# Patient Record
Sex: Male | Born: 1940 | Race: White | Hispanic: No | Marital: Married | Smoking: Former smoker
Health system: Southern US, Community
[De-identification: ages and names within clinical notes are randomized; demographics above are authoritative.]

## PROBLEM LIST (undated history)

## (undated) DIAGNOSIS — K209 Esophagitis, unspecified without bleeding: Secondary | ICD-10-CM

## (undated) DIAGNOSIS — I2510 Atherosclerotic heart disease of native coronary artery without angina pectoris: Secondary | ICD-10-CM

## (undated) DIAGNOSIS — E785 Hyperlipidemia, unspecified: Secondary | ICD-10-CM

## (undated) DIAGNOSIS — K449 Diaphragmatic hernia without obstruction or gangrene: Secondary | ICD-10-CM

## (undated) DIAGNOSIS — K219 Gastro-esophageal reflux disease without esophagitis: Secondary | ICD-10-CM

## (undated) DIAGNOSIS — K2270 Barrett's esophagus without dysplasia: Secondary | ICD-10-CM

## (undated) DIAGNOSIS — N529 Male erectile dysfunction, unspecified: Secondary | ICD-10-CM

## (undated) DIAGNOSIS — K635 Polyp of colon: Secondary | ICD-10-CM

## (undated) DIAGNOSIS — M1990 Unspecified osteoarthritis, unspecified site: Secondary | ICD-10-CM

## (undated) DIAGNOSIS — C61 Malignant neoplasm of prostate: Secondary | ICD-10-CM

## (undated) DIAGNOSIS — I10 Essential (primary) hypertension: Secondary | ICD-10-CM

## (undated) HISTORY — DX: Diaphragmatic hernia without obstruction or gangrene: K44.9

## (undated) HISTORY — DX: Atherosclerotic heart disease of native coronary artery without angina pectoris: I25.10

## (undated) HISTORY — DX: Essential (primary) hypertension: I10

## (undated) HISTORY — DX: Esophagitis, unspecified without bleeding: K20.90

## (undated) HISTORY — PX: REPLACEMENT TOTAL KNEE: SUR1224

## (undated) HISTORY — DX: Barrett's esophagus without dysplasia: K22.70

## (undated) HISTORY — DX: Male erectile dysfunction, unspecified: N52.9

## (undated) HISTORY — DX: Unspecified osteoarthritis, unspecified site: M19.90

## (undated) HISTORY — DX: Esophagitis, unspecified: K20.9

## (undated) HISTORY — DX: Malignant neoplasm of prostate: C61

## (undated) HISTORY — PX: PROSTATE SURGERY: SHX751

## (undated) HISTORY — DX: Polyp of colon: K63.5

## (undated) HISTORY — DX: Hyperlipidemia, unspecified: E78.5

## (undated) HISTORY — DX: Gastro-esophageal reflux disease without esophagitis: K21.9

---

## 1995-10-05 HISTORY — PX: CORONARY ARTERY BYPASS GRAFT: SHX141

## 2001-08-21 HISTORY — PX: ANGIOPLASTY: SHX39

## 2008-12-20 ENCOUNTER — Encounter: Admission: RE | Admit: 2008-12-20 | Discharge: 2008-12-20 | Payer: Self-pay | Admitting: Orthopedic Surgery

## 2010-06-12 ENCOUNTER — Ambulatory Visit: Payer: Self-pay | Admitting: Diagnostic Radiology

## 2010-06-12 ENCOUNTER — Emergency Department (HOSPITAL_BASED_OUTPATIENT_CLINIC_OR_DEPARTMENT_OTHER): Admission: EM | Admit: 2010-06-12 | Discharge: 2010-06-13 | Payer: Self-pay | Admitting: Emergency Medicine

## 2010-12-17 LAB — CBC
MCH: 27.8 pg (ref 26.0–34.0)
MCV: 82.7 fL (ref 78.0–100.0)
Platelets: 399 10*3/uL (ref 150–400)
RBC: 5.34 MIL/uL (ref 4.22–5.81)

## 2010-12-17 LAB — COMPREHENSIVE METABOLIC PANEL
Albumin: 4.1 g/dL (ref 3.5–5.2)
BUN: 33 mg/dL — ABNORMAL HIGH (ref 6–23)
Chloride: 106 mEq/L (ref 96–112)
Creatinine, Ser: 1.5 mg/dL (ref 0.4–1.5)
GFR calc non Af Amer: 46 mL/min — ABNORMAL LOW (ref >60)
Glucose, Bld: 160 mg/dL — ABNORMAL HIGH (ref 70–99)
Total Bilirubin: 0.8 mg/dL (ref 0.3–1.2)

## 2010-12-17 LAB — POCT CARDIAC MARKERS
CKMB, poc: 1.1 ng/mL (ref 1.0–8.0)
Myoglobin, poc: 130 ng/mL (ref 12–200)
Troponin i, poc: <0.05 ng/mL (ref 0.00–0.09)

## 2010-12-17 LAB — DIFFERENTIAL
Basophils Absolute: 0.1 10*3/uL (ref 0.0–0.1)
Lymphocytes Relative: 35 % (ref 12–46)
Neutro Abs: 5.4 10*3/uL (ref 1.7–7.7)
Neutrophils Relative %: 54 % (ref 43–77)

## 2013-01-12 ENCOUNTER — Other Ambulatory Visit: Payer: Self-pay | Admitting: Family Medicine

## 2013-01-26 ENCOUNTER — Other Ambulatory Visit: Payer: Self-pay | Admitting: Family Medicine

## 2013-03-28 ENCOUNTER — Ambulatory Visit (INDEPENDENT_AMBULATORY_CARE_PROVIDER_SITE_OTHER): Payer: Self-pay | Admitting: Family Medicine

## 2013-03-28 VITALS — BP 147/87 | HR 67 | Wt 202.0 lb

## 2013-03-28 DIAGNOSIS — R070 Pain in throat: Secondary | ICD-10-CM

## 2013-03-28 MED ORDER — MAGIC MOUTHWASH: ORAL | Status: DC

## 2013-03-28 NOTE — Patient Instructions (Addendum)
1)  Hoarseness/Throat Symptoms - Salt Water Gargle, Duke's Magic Mouthwash, Umcka Cold Care (2 droppers 3-4 x per day)    Salt Water Gargle This solution will help make your mouth and throat feel better. HOME CARE INSTRUCTIONS   Mix 1 teaspoon of salt in 8 ounces of warm water.  Gargle with this solution as much or often as you need or as directed. Swish and gargle gently if you have any sores or wounds in your mouth.  Do not swallow this mixture. Document Released: 06/24/2004 Document Revised: 12/13/2011 Document Reviewed: 11/15/2008 [facility] Patient Information 2014 [place], [place].   Hoarseness Hoarseness is produced from a variety of causes. It is important to find the cause so it can be treated. In the absence of a cold or upper respiratory illness, any hoarseness lasting more than 2 weeks should be looked at by a specialist. This is especially important if you have a history of smoking or alcohol use. It is also important to keep in mind that as you grow older, your voice will naturally get weaker, making it easier for you to become hoarse from straining your vocal cords.  CAUSES  Any illness that affects your vocal cords can result in a hoarse voice. Examples of conditions that can affect the vocal cords are listed as follows:   Allergies.  Colds.  Sinusitis.  Gastroesophageal reflux disease.  Croup.  Injury.  Nodules.  Exposure to smoke or toxic fumes or gases.  Congenital and genetic defects.  Paralysis of the vocal cords.  Infections.  Advanced age. DIAGNOSIS  In order to diagnose the cause of your hoarseness, your caregiver will examine your throat using an instrument that uses a tube with a small lighted camera (laryngoscope). It allows your caregiver to look into the mouth and down the throat. TREATMENT  For most cases, treatment will focus on the specific cause of the hoarseness. Depending on the cause, hoarseness can be a temporary condition (acute) or it  can be long lasting (chronic). Most cases of hoarseness clear up without complications. Your caregiver will explain to you if this is not likely to happen. SEEK IMMEDIATE MEDICAL CARE IF:   You have increasing hoarseness or loss of voice.  You have shortness of breath.  You are coughing up blood.  There is pain in your neck or throat. Document Released: 09/03/2005 Document Revised: 12/13/2011 Document Reviewed: 11/26/2010 [facility] Patient Information 2014 [place], [place].

## 2013-03-28 NOTE — Progress Notes (Signed)
  Subjective:    Patient ID: Jonathan Wall, male    DOB: [birth year], 72 y.o.   MRN: [id number]  HPI  Jonathan Wall is here today complaining of URI symptoms including chest congestion and hoarseness which started several days ago.  He describes his symptoms as being mild to moderate in nature and he feels that he is not improving.  He feels that his symptoms may be related to allergies vs irritation due to his recent endocscopy.     Review of Systems  Constitutional: Negative for fever and chills.  HENT: Positive for congestion and sore throat.   Respiratory: Positive for cough. Negative for shortness of breath.   Cardiovascular: Negative for chest pain.  All other systems reviewed and are negative.   Past Medical History  Diagnosis Date  . CAD (coronary artery disease)   . Prostate cancer   . Hypertension   . Hyperlipidemia   . Osteoarthritis   . ED (erectile dysfunction)   . Hiatal hernia   . Colon polyps   . Barrett's esophagus     Clotest was negative, Dr. Myra Gianotti  . Esophagitis     Family History  Problem Relation Age of Onset  . Alzheimer's disease Mother   . Alzheimer's disease Father   . Heart disease Brother   . Alcohol abuse Brother   . Heart disease Brother    History   Social History Narrative   Marital Status: Married Chemical engineer)   Children:  Son Casimiro Needle)   Pets:  None   Living Situation: Lives with his wife.   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     Alcohol Use: None   Drug Use:  None   Diet:  Regular   Exercise: Minimal 1-2 x per month   Hobbies: Internet, Writing             Objective:   Physical Exam  Constitutional: He appears well-nourished. No distress.  HENT:  Mouth/Throat: No oropharyngeal exudate.  Eyes: Conjunctivae are normal.  Neck: Neck supple.  Cardiovascular: Normal rate, regular rhythm and normal heart sounds.    Pulmonary/Chest: Effort normal and breath sounds normal. No respiratory distress. He has no wheezes. He has no rales.  Lymphadenopathy:    He has no cervical adenopathy.          Assessment & Plan:

## 2013-04-10 ENCOUNTER — Encounter: Payer: Self-pay | Admitting: Family Medicine

## 2013-04-10 ENCOUNTER — Ambulatory Visit (HOSPITAL_BASED_OUTPATIENT_CLINIC_OR_DEPARTMENT_OTHER)
Admission: RE | Admit: 2013-04-10 | Discharge: 2013-04-10 | Disposition: A | Discharge status: Home / Self Care | Payer: Medicare Other | Source: Ambulatory Visit | Attending: Family Medicine | Admitting: Family Medicine

## 2013-04-10 ENCOUNTER — Ambulatory Visit (INDEPENDENT_AMBULATORY_CARE_PROVIDER_SITE_OTHER): Payer: Medicare Other | Admitting: Family Medicine

## 2013-04-10 ENCOUNTER — Other Ambulatory Visit: Payer: Self-pay | Admitting: Family Medicine

## 2013-04-10 VITALS — BP 129/74 | HR 62 | Wt 202.0 lb

## 2013-04-10 DIAGNOSIS — R52 Pain, unspecified: Secondary | ICD-10-CM

## 2013-04-10 DIAGNOSIS — M799 Soft tissue disorder, unspecified: Secondary | ICD-10-CM | POA: Insufficient documentation

## 2013-04-10 DIAGNOSIS — S46212A Strain of muscle, fascia and tendon of other parts of biceps, left arm, initial encounter: Secondary | ICD-10-CM

## 2013-04-10 DIAGNOSIS — S43499A Other sprain of unspecified shoulder joint, initial encounter: Secondary | ICD-10-CM

## 2013-04-10 DIAGNOSIS — S46219A Strain of muscle, fascia and tendon of other parts of biceps, unspecified arm, initial encounter: Secondary | ICD-10-CM | POA: Insufficient documentation

## 2013-04-10 DIAGNOSIS — S46819A Strain of other muscles, fascia and tendons at shoulder and upper arm level, unspecified arm, initial encounter: Secondary | ICD-10-CM

## 2013-04-10 NOTE — Progress Notes (Signed)
  Subjective:    Patient ID: Jonathan Wall, male    DOB: 29-Sep-1941, 72 y.o.   MRN: [id number]  Jonathan Wall is here today to have his left arm evaluated.  On Friday, he was packing his car and lifting various objects (e.g. Cooler with watermelon) when he felt a pop in his left arm.  Almost immediately, he noticed that there was a large lump in his upper arm.  Arm Pain  The incident occurred 3 to 5 days ago. The incident occurred at home. Injury mechanism: lifting a cooler with a watermelon that shifted. The pain is present in the upper left arm. The quality of the pain is described as aching and stabbing. The pain does not radiate. The pain is at a severity of 4/10. The pain is mild. The pain has been fluctuating since the incident. Pertinent negatives include no muscle weakness, numbness or tingling. The symptoms are aggravated by movement and lifting. He has tried ice for the symptoms. The treatment provided no relief.     Review of Systems  Musculoskeletal:       Large lump in upper left arm  Neurological: Negative for tingling and numbness.       Objective:   Physical Exam  Constitutional: He appears well-nourished. No distress.  Musculoskeletal:  Swelling in left upper arm consistent with torn vs unattached bicep muscle   Skin: Skin is warm and dry.          Assessment & Plan:

## 2013-04-10 NOTE — Patient Instructions (Addendum)
Biceps Tendon Disruption (Proximal) with Rehab  Appointment:  04/10/2013 (Dr. Thamas Jaegers) 4 pm    The biceps tendon attaches the biceps muscle to the bones of the elbow and the shoulder. A proximal biceps tendon disruption is a tear of the long head of the tendon that attaches near the shoulder (more common) or a tear in the muscle near the muscle tendon junction (less common). These injuries usually involve a complete tear of the tendon from the bone. However, partial tears are also possible. The biceps muscle works with other muscles to bend the elbow and rotate the palm upward (supinate). A complete biceps rupture will result in about a 10% decrease in elbow bending strength and a 20% decrease in your ability to turn the palm upward, using the wrist. SYMPTOMS   Pain, tenderness, swelling, warmth, or redness over the front of the shoulder.  Pain that gets worse with shoulder and elbow use, especially against resistance (lifting or carrying).  Bruising (contusion) in the arm or elbow after 24 to 48 hours.  Bulge can be seen and felt in the arm.  Limited motion of the shoulder or elbow.  Weakness with attempted elbow bending or rotation of the wrist, such as when using a screwdriver.  Crackling sound (crepitation) when the tendon or shoulder is moved or touched. CAUSES  A biceps tendon rupture occurs when the tendon is subjected to a force that is greater than it can withstand. For example, a sudden force straightening the elbow while the biceps is flexed, or a direct hit (trauma) (rare). RISK INCREASES WITH:   Sports that involve contact, or throwing and overhead activities (racquet sports, gymnastics, weightlifting, bodybuilding).  Heavy labor.  Poor strength and flexibility.  Failure to warm up properly before activity. PREVENTION  Warm up and stretch properly before activity.  Maintain physical fitness:  Strength, flexibility, and endurance.  Cardiovascular fitness.  Allow  your body to recover between practices and competition.  Learn and use proper exercise technique. PROGNOSIS  If treated properly, the symptoms of a proximal biceps tendon disruption usually go away within 12 weeks of injury.  RELATED COMPLICATIONS  Longer healing time if not properly treated, or if not given enough time to heal.  Recurring symptoms, especially if activity is resumed too soon.  Weakness of elbow bending and forearm rotation.  Prolonged disability (uncommon). TREATMENT Treatment first involves the use of ice and medicine, to reduce pain and inflammation. It is also important to perform strengthening and stretching exercises and to modify activities that cause symptoms to get worse. These exercises may be performed at home or with a therapist. It is not possible to surgically fix the tendon (sew it together). However, surgery may be performed to reinsert the tendon into the arm bone. This will make the arm look normal again. Surgery is most often advised for younger, active individuals, especially those who require strength of wrist rotation.  MEDICATION  If pain medicine is needed, nonsteroidal anti-inflammatory medicines (aspirin and ibuprofen), or other minor pain relievers (acetaminophen), are often advised.  Do not take pain medicine for 7 days before surgery.  Prescription pain relievers may be given if your caregiver thinks they are needed. Use only as directed and only as much as you need.  Corticosteroid injections may be given to help reduce inflammation, but are not usually recommended for this injury. HEAT AND COLD  Cold treatment (icing) should be applied for 10 to 15 minutes every 2 to 3 hours for inflammation and pain,  and immediately after activity that aggravates your symptoms. Use ice packs or an ice massage.  Heat treatment may be used before performing stretching and strengthening activities prescribed by your caregiver, physical therapist, or athletic  trainer. Use a heat pack or a warm water soak. SEEK MEDICAL CARE IF:   Symptoms get worse or do not improve in 2 weeks, despite treatment.  New, unexplained symptoms develop. (Drugs used in treatment may produce side effects.) EXERCISES RANGE OF MOTION (ROM) AND STRETCHING EXERCISES - Biceps Tendon Disruption (Proximal) These exercises may help you when beginning to rehabilitate your injury. Your symptoms may go away with or without further involvement from your physician, physical therapist or athletic trainer. While completing these exercises, remember:   Restoring tissue flexibility helps normal motion to return to the joints. This allows healthier, less painful movement and activity.  An effective stretch should be held for at least 30 seconds.  A stretch should never be painful. You should only feel a gentle lengthening or release in the stretched tissue. STRETCH  Flexion, Standing  Stand with good posture. With an underhand grip on your right / left hand and an overhand grip on the opposite hand, grasp a broomstick or cane so that your hands are a little more than shoulder width apart.  Keeping your right / left elbow straight and shoulder muscles relaxed, push the stick with your opposite hand to raise your right / left arm in front of your body and then overhead. Raise your arm until you feel a stretch in your right / left shoulder, but before you have increased shoulder pain.  Try to avoid shrugging your right / left shoulder as your arm rises by keeping your shoulder blade tucked down and toward your mid-back spine. Hold __________ seconds.  Slowly return to the starting position. Repeat __________ times. Complete this exercise __________ times per day. STRETCH  Abduction, Supine  Stand with good posture. With an underhand grip on your right / left hand and an overhand grip on the opposite hand, grasp a broomstick or cane so that your hands are a little more than shoulder-width  apart.  Keeping your right / left elbow straight and shoulder muscles relaxed, push the stick with your opposite hand to raise your right / left arm out to the side of your body and then overhead. Raise your arm until you feel a stretch in your right / left shoulder, but before you have increased shoulder pain.  Try to avoid shrugging your right / left shoulder as your arm rises, by keeping your shoulder blade tucked down and toward your mid-back spine. Hold for __________ seconds.  Slowly return to the starting position. Repeat __________ times. Complete this exercise __________ times per day. ROM  Flexion, Active-Assisted  Lie on your back. You may bend your knees for comfort.  Grasp a broomstick or cane so your hands are about shoulder width apart. Your right / left hand should grip the end of the stick so that your hand is positioned "thumbs-up," as if you were about to shake hands.  Using your healthy arm to lead, raise your right / left arm overhead until you feel a gentle stretch in your shoulder. Hold for right / left seconds.  Use the stick to assist in returning your right / left arm to its starting position. Repeat __________ times. Complete this exercise __________ times per day.  STRETCH  Flexion, Standing   Stand facing a wall. Walk your right / left fingers  up the wall until you feel a moderate stretch in your shoulder. As your hand gets higher, you may need to step closer to the wall or use a door frame to walk through.  Try to avoid shrugging your right / left shoulder as your arm rises, by keeping your shoulder blade tucked down and toward your mid-back spine.  Hold for __________ seconds. Use your other hand, if needed, to ease out of the stretch and return to the starting position. Repeat __________ times. Complete this exercise __________ times per day.  ROM - Internal Rotation   Using underhand grips, grasp a stick behind your back with both hands.  While standing  upright with good posture, slide the stick up your back until you feel a mild stretch in the front of your shoulder.  Hold for __________ seconds. Slowly return to your starting position. Repeat __________ times. Complete this exercise __________ times per day.  STRETCH - Internal Rotation  Place your right / left hand behind your back, palm-up.  Throw a towel or belt over your opposite shoulder. Grasp the towel with your right / left hand.  While keeping an upright posture, gently pull up on the towel until you feel a stretch in the front of your right / left shoulder.  Avoid shrugging your right / left shoulder as your arm rises, by keeping your shoulder blade tucked down and toward your mid-back spine.  Hold for __________ seconds. Release the stretch by lowering your opposite hand. Repeat __________ times. Complete this exercise __________ times per day. STRETCH  Elbow Flexors   Lie on a firm bed or countertop on your back. Be sure that you are in a comfortable position which will allow you to relax your arm muscles.  Place a folded towel under your right / left upper arm, so that your elbow and shoulder are at the same height. Extend your arm; your elbow should not rest on the bed or towel.  Allow the weight of your hand to straighten your elbow. Keep your arm and chest muscles relaxed. Your caregiver may ask you to increase the intensity of your stretch by adding a small wrist or hand weight.  Hold for __________ seconds. You should feel a stretch on the inside of your elbow. Slowly return to the starting position. Repeat __________ times. Complete this exercise __________ times per day. STRENGTHENING EXERCISES - Biceps Tendon Disruption (Proximal) These exercises may help you regain your strength after your physician has discontinued your restraint in a cast or brace. They may resolve your symptoms with or without further involvement from your physician, physical therapist or  athletic trainer. While completing these exercises, remember:   Muscles can gain both the endurance and the strength needed for everyday activities through controlled exercises.  Complete these exercises as instructed by your physician, physical therapist or athletic trainer. Progress the resistance and repetitions only as guided.  You may experience muscle soreness or fatigue, but the pain or discomfort you are trying to eliminate should never worsen during these exercises. If this pain does get worse, stop and make sure you are following the directions exactly. If the pain is still present after adjustments, discontinue the exercise until you can discuss the trouble with your clinician. STRENGTH - Elbow Flexors, Isometric   Stand or sit upright on a firm surface. Place your right / left arm so that your hand is palm-up and at the height of your waist.  Place your opposite hand on top of  your forearm. Gently push down as your right / left arm resists. Push as hard as you can with both arms, without causing any pain or movement at your right / left elbow. Hold this stationary position for __________ seconds.  Gradually release the tension in both arms. Allow your muscles to relax completely before repeating. Repeat __________ times. Complete this exercise __________ times per day. STRENGTH - Shoulder Flexion, Isometric  With good posture and facing a wall, stand or sit about 4-6 inches away.  Keeping your right / left elbow straight, gently press the top of your fist into the wall. Increase the pressure gradually until you are pressing as hard as you can without shrugging your shoulder or increasing any shoulder discomfort.  Hold for __________ seconds.  Release the tension slowly. Relax your shoulder muscles completely before you the next repetition. Repeat __________ times. Complete this exercise __________ times per day.  STRENGTH  Elbow Flexors, Supinated  With good posture, stand or  sit on a firm chair without armrests. Allow your right / left arm to rest at your side with your palm facing forward.  Holding a __________weight or gripping a rubber exercise band or tubing, bring your hand toward your shoulder.  Allow your muscles to control the resistance as your hand returns to your side. Repeat __________ times. Complete this exercise __________ times per day.  STRENGTH  Elbow Flexors, Neutral  With good posture, stand or sit on a firm chair without armrests. Allow your right / left arm to rest at your side with your thumb facing forward.  Holding a __________ weight or gripping a rubber exercise band or tubing, bring your hand toward your shoulder.  Allow your muscles to control the resistance as your hand returns to your side. Repeat __________ times. Complete this exercise __________ times per day.  STRENGTH - Shoulder Flexion   Stand or sit with good posture. Grasp a __________ weight, or an exercise band or tubing, so that your hand is "thumbs-up," like when you shake hands.  Slowly lift your right / left arm as far as you can without increasing any shoulder pain. Initially, many people can only raise their hand to shoulder height.  Avoid shrugging your right / left shoulder as your arm rises, by keeping your shoulder blade tucked down and toward your mid-back spine.  Hold for __________ seconds. Control the descent of your hand as you slowly return to your starting position. Repeat __________ times. Complete this exercise __________ times per day.  STRENGTH  Forearm Supinators   Sit with your right / left forearm supported on a table, keeping your elbow below shoulder height. Rest your hand over the edge, palm down.  Gently grip a hammer or a soup ladle.  Without moving your elbow, slowly turn your palm and hand upward to a "thumbs-up" position.  Hold this position for __________ seconds. Slowly return to the starting position. Repeat __________ times.  Complete this exercise __________ times per day.  STRENGTH  Forearm Pronators   Sit with your right / left forearm supported on a table, keeping your elbow below shoulder height. Rest your hand over the edge, palm up.  Gently grip a hammer or a soup ladle.  Without moving your elbow, slowly turn your palm and hand upward to a "thumbs-up" position.  Hold this position for __________ seconds. Slowly return to the starting position. Repeat __________ times. Complete this exercise __________ times per day.  Document Released: 09/20/2005 Document Revised: 12/13/2011 Document Reviewed:  01/02/2009 [employer] Patient Information 2014 [place], [place].

## 2013-04-12 ENCOUNTER — Other Ambulatory Visit: Payer: Self-pay | Admitting: Family Medicine

## 2013-04-17 ENCOUNTER — Other Ambulatory Visit: Payer: Self-pay | Admitting: Family Medicine

## 2013-05-10 ENCOUNTER — Encounter: Payer: Self-pay | Admitting: Family Medicine

## 2013-05-10 DIAGNOSIS — R070 Pain in throat: Secondary | ICD-10-CM | POA: Insufficient documentation

## 2013-05-10 NOTE — Assessment & Plan Note (Signed)
He was given a prescription for Duke's Magic Mouthwash.

## 2013-05-12 NOTE — Assessment & Plan Note (Signed)
Sent for an x-ray and to [facility] for further evaluation.

## 2013-06-01 ENCOUNTER — Other Ambulatory Visit: Payer: Self-pay | Admitting: Family Medicine

## 2013-06-01 NOTE — Telephone Encounter (Signed)
Several of his meds have been refilled as courtesy.  I just sent 30 day of his metoprolol.  He needs labs and to see Dr Herma Carson. Thanks PG

## 2013-06-06 ENCOUNTER — Other Ambulatory Visit: Payer: Self-pay | Admitting: Family Medicine

## 2013-06-07 NOTE — Telephone Encounter (Signed)
This pt needs an appointment for labs and see Dr Herma Carson.  We have seen him a couple of times for acute problems.  Thanks PG

## 2013-06-08 ENCOUNTER — Other Ambulatory Visit: Payer: Medicare Other

## 2013-06-08 ENCOUNTER — Other Ambulatory Visit: Payer: Self-pay | Admitting: *Deleted

## 2013-06-08 DIAGNOSIS — R5381 Other malaise: Secondary | ICD-10-CM

## 2013-06-08 DIAGNOSIS — E785 Hyperlipidemia, unspecified: Secondary | ICD-10-CM

## 2013-06-11 ENCOUNTER — Other Ambulatory Visit: Payer: Self-pay | Admitting: Family Medicine

## 2013-06-11 ENCOUNTER — Other Ambulatory Visit (INDEPENDENT_AMBULATORY_CARE_PROVIDER_SITE_OTHER): Payer: Medicare Other

## 2013-06-11 LAB — CBC WITH DIFFERENTIAL/PLATELET
Basophils Absolute: 0.1 10*3/uL (ref 0.0–0.1)
Basophils Relative: 1 % (ref 0–1)
Eosinophils Absolute: 0.9 10*3/uL — ABNORMAL HIGH (ref 0.0–0.7)
Eosinophils Relative: 14 % — ABNORMAL HIGH (ref 0–5)
HCT: 43 % (ref 39.0–52.0)
Hemoglobin: 14.8 g/dL (ref 13.0–17.0)
Lymphocytes Relative: 28 % (ref 12–46)
Lymphs Abs: 1.8 10*3/uL (ref 0.7–4.0)
MCH: 30.1 pg (ref 26.0–34.0)
MCHC: 34.4 g/dL (ref 30.0–36.0)
MCV: 87.6 fL (ref 78.0–100.0)
Monocytes Absolute: 0.6 10*3/uL (ref 0.1–1.0)
Monocytes Relative: 9 % (ref 3–12)
Neutro Abs: 3 10*3/uL (ref 1.7–7.7)
Neutrophils Relative %: 48 % (ref 43–77)
Platelets: 241 10*3/uL (ref 150–400)
RBC: 4.91 MIL/uL (ref 4.22–5.81)
RDW: 14.3 % (ref 11.5–15.5)
WBC: 6.3 10*3/uL (ref 4.0–10.5)

## 2013-06-11 LAB — COMPLETE METABOLIC PANEL WITH GFR
ALT: 19 U/L (ref 0–53)
AST: 21 U/L (ref 0–37)
Albumin: 3.8 g/dL (ref 3.5–5.2)
Alkaline Phosphatase: 86 U/L (ref 39–117)
BUN: 17 mg/dL (ref 6–23)
CO2: 25 mEq/L (ref 19–32)
Calcium: 9.1 mg/dL (ref 8.4–10.5)
Chloride: 110 mEq/L (ref 96–112)
Creat: 0.84 mg/dL (ref 0.50–1.35)
GFR, Est African American: >89 mL/min
GFR, Est Non African American: 87 mL/min
Glucose, Bld: 104 mg/dL — ABNORMAL HIGH (ref 70–99)
Potassium: 4.2 mEq/L (ref 3.5–5.3)
Sodium: 141 mEq/L (ref 135–145)
Total Bilirubin: 0.5 mg/dL (ref 0.3–1.2)
Total Protein: 6 g/dL (ref 6.0–8.3)

## 2013-06-11 LAB — LIPID PANEL
Cholesterol: 117 mg/dL (ref 0–200)
HDL: 34 mg/dL — ABNORMAL LOW (ref >39)
LDL Cholesterol: 60 mg/dL (ref 0–99)
Total CHOL/HDL Ratio: 3.4 Ratio
Triglycerides: 117 mg/dL (ref <150)
VLDL: 23 mg/dL (ref 0–40)

## 2013-06-14 ENCOUNTER — Ambulatory Visit (INDEPENDENT_AMBULATORY_CARE_PROVIDER_SITE_OTHER): Payer: Medicare Other | Admitting: Family Medicine

## 2013-06-14 ENCOUNTER — Ambulatory Visit: Payer: Medicare Other | Admitting: Family Medicine

## 2013-06-14 ENCOUNTER — Encounter: Payer: Self-pay | Admitting: Family Medicine

## 2013-06-14 VITALS — BP 106/59 | HR 66 | Resp 16 | Ht 67.0 in | Wt 202.0 lb

## 2013-06-14 DIAGNOSIS — R7301 Impaired fasting glucose: Secondary | ICD-10-CM

## 2013-06-14 DIAGNOSIS — E785 Hyperlipidemia, unspecified: Secondary | ICD-10-CM

## 2013-06-14 DIAGNOSIS — Z23 Encounter for immunization: Secondary | ICD-10-CM

## 2013-06-14 DIAGNOSIS — I25810 Atherosclerosis of coronary artery bypass graft(s) without angina pectoris: Secondary | ICD-10-CM

## 2013-06-14 DIAGNOSIS — I10 Essential (primary) hypertension: Secondary | ICD-10-CM

## 2013-06-14 DIAGNOSIS — M25569 Pain in unspecified knee: Secondary | ICD-10-CM

## 2013-06-14 DIAGNOSIS — G8929 Other chronic pain: Secondary | ICD-10-CM

## 2013-06-14 DIAGNOSIS — K219 Gastro-esophageal reflux disease without esophagitis: Secondary | ICD-10-CM

## 2013-06-14 MED ORDER — BENAZEPRIL HCL 20 MG PO TABS: 20.0000 mg | ORAL_TABLET | Freq: Every day | ORAL | Status: DC

## 2013-06-14 MED ORDER — CLOPIDOGREL BISULFATE 75 MG PO TABS: 75.0000 mg | ORAL_TABLET | Freq: Every day | ORAL | Status: DC

## 2013-06-14 MED ORDER — DICLOFENAC SODIUM 75 MG PO TBEC: 75.0000 mg | DELAYED_RELEASE_TABLET | Freq: Two times a day (BID) | ORAL | Status: DC

## 2013-06-14 MED ORDER — ATORVASTATIN CALCIUM 80 MG PO TABS: 80.0000 mg | ORAL_TABLET | Freq: Every day | ORAL | Status: DC

## 2013-06-14 MED ORDER — METOPROLOL TARTRATE 25 MG PO TABS: 25.0000 mg | ORAL_TABLET | Freq: Two times a day (BID) | ORAL | Status: DC

## 2013-06-14 MED ORDER — PANTOPRAZOLE SODIUM 40 MG PO TBEC: 40.0000 mg | DELAYED_RELEASE_TABLET | Freq: Every day | ORAL | Status: DC

## 2013-06-14 MED ORDER — NIACIN ER (ANTIHYPERLIPIDEMIC) 1000 MG PO TBCR: EXTENDED_RELEASE_TABLET | ORAL | Status: DC

## 2013-06-14 MED ORDER — AMLODIPINE BESYLATE 5 MG PO TABS: 5.0000 mg | ORAL_TABLET | Freq: Every day | ORAL | Status: DC

## 2013-06-14 NOTE — Patient Instructions (Addendum)
1)  BP - Your BP is even better than it needs to be so you can decrease your amlodipine to 5 mg.  See how it looks at the cardiologist's office and send RX in to [facility] if they are fine with you being on the 5 mg.   2)  Cholesterol - Stay on the 80 mg at least until you see the cardiologist.   3)  Blood Sugar - Decrease your sugar/bad carb intake.       Insulin Resistance Blood sugar (glucose) levels are controlled by a hormone called insulin. Insulin is made by your pancreas. When your blood glucose goes up, insulin is released into your blood. Insulin is required for your body to function normally. However, your body can become resistant to your own insulin or to insulin given to treat diabetes. In either case, insulin resistance can lead to serious problems. These problems include:  Type 2 diabetes.  Heart disease.  High blood pressure.  Stroke.  Polycystic ovary syndrome.  Fatty liver. CAUSES  Insulin resistance can develop for many different reasons. It is more likely to happen in people with these conditions or characteristics:  Obesity.  Inactivity.  Pregnancy.  High blood pressure.  Stress.  Steroid use.  Infection or severe illness.  Increased levels of cholesterol and triglycerides. SYMPTOMS  There are no symptoms. You may have symptoms related to the various complications of insulin resistance.  DIAGNOSIS  Several different things can make your caregiver suspect you have insulin resistance. These include:  High blood glucose (hyperglycemia).  Abnormal cholesterol levels.  High uric acid levels.  Changes related to blood pressure.  Changes related to inflammation. Insulin resistance can be determined with blood tests. An elevated insulin level when you have not eaten might suggest resistance. Other more complicated tests are sometimes necessary. TREATMENT  Lifestyle changes are the most important treatment for insulin resistance.   If you are  overweight and you have insulin resistance, you can improve your insulin sensitivity by losing weight.  Moderate exercise for 30 40 minutes, 4 days a week, can improve insulin sensitivity. Some medicines can also help improve your insulin sensitivity. Your caregiver can discuss these with you if they are appropriate.  HOME CARE INSTRUCTIONS   Do not smoke.  Keep your weight at a healthy level.  Get exercise.  If you have diabetes, follow your caregiver's directions.  If you have high blood pressure, follow your caregiver's directions.  Only take prescription medicines for pain, fever, or discomfort as directed by your caregiver. SEEK MEDICAL CARE IF:   You are diabetic and you are having problems keeping your blood glucose levels at target range.  You are having episodes of low blood glucose (hypoglycemia).  You feel you might be having side effects from your medicines.  You have symptoms of an illness that is not improving after 3 4 days.  You have a sore or wound that is not healing.  You notice a change in vision or a new problem with your vision. SEEK IMMEDIATE MEDICAL CARE IF:   Your blood glucose goes below 70, especially if you have confusion, lightheadedness, or other symptoms with it.  Your blood glucose is very high (as advised by your caregiver) twice in a row.  You pass out.  You have chest pain or trouble breathing.  You have a sudden, severe headache.  You have sudden weakness in one arm or one leg.  You have sudden difficulty speaking or swallowing.  You  develop vomiting or diarrhea that is getting worse or not improving after 1 day. Document Released: 11/09/2005 Document Revised: 03/21/2012 Document Reviewed: 11/07/2008 [facility] Patient Information 2014 [place], [place].

## 2013-06-14 NOTE — Progress Notes (Signed)
  Subjective:    Patient ID: Jonathan Wall, male    DOB: 12-Mar-1941, 72 y.o.   MRN: [id number]  HPI  Jonathan Wall is here today to go over his most recent lab results, to discuss the conditions listed below and to get some of his medications refilled.   1)  Hyperlipidemia: He continues taking niaspan and atorvastatin.  He has been eating much healthier for the past year and would like to come off some of his medications.   2)  Hypertension:  His blood pressure is well controlled with benazepril 20 mg (1/2 of the 40 mg) and metoprolol 25 mg BID with amlodipine 10 mg.  He needs refills on both medications.   3)  GERD:  He needs a refill on his pantoprazole.    4)  Osteoarthritis:  He needs a refill for his diclofenac.   5)  CAD:  He is doing well and is not having any cardiac symptoms.     Review of Systems  Constitutional: Negative.   HENT: Negative.   Eyes: Negative.   Respiratory: Negative.   Cardiovascular: Negative.   Gastrointestinal: Negative.   Endocrine: Negative.   Genitourinary: Negative.   Musculoskeletal: Negative.   Skin: Negative.   Allergic/Immunologic: Negative.   Neurological: Negative.   Hematological: Negative.   Psychiatric/Behavioral: Negative.      Past Medical History  Diagnosis Date  . CAD (coronary artery disease)   . Prostate cancer   . Hypertension   . Hyperlipidemia   . Osteoarthritis   . ED (erectile dysfunction)   . Hiatal hernia   . Colon polyps   . Barrett's esophagus     Clotest was negative, Dr. Myra Gianotti  . Esophagitis      Family History  Problem Relation Age of Onset  . Alzheimer's disease Mother   . Alzheimer's disease Father   . Heart disease Brother   . Alcohol abuse Brother   . Heart disease Brother      History   Social History Narrative   Marital Status: Married Chemical engineer)   Children:  Son Jonathan Wall)   Pets:  None   Living Situation: Lives with his wife.   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     Alcohol Use: None   Drug Use:  None   Diet:  Regular   Exercise: Minimal 1-2 x per month   Hobbies: Internet, Writing               Objective:   Physical Exam  Vitals reviewed. Constitutional: He is oriented to person, place, and time. He appears well-developed and well-nourished. No distress.  HENT:  Head: Normocephalic.  Eyes: No scleral icterus.  Neck: Neck supple. No thyromegaly present.  Cardiovascular: Normal rate, regular rhythm and normal heart sounds.  Exam reveals no gallop and no friction rub.   No murmur heard. Pulmonary/Chest: Effort normal and breath sounds normal. No respiratory distress. He exhibits no tenderness.  Musculoskeletal: He exhibits no edema.  Neurological: He is alert and oriented to person, place, and time.  Skin: Skin is warm and dry. No rash noted.  Psychiatric: He has a normal mood and affect. His behavior is normal. Judgment and thought content normal.     Assessment & Plan:

## 2013-08-18 DIAGNOSIS — G8929 Other chronic pain: Secondary | ICD-10-CM | POA: Insufficient documentation

## 2013-08-18 DIAGNOSIS — I10 Essential (primary) hypertension: Secondary | ICD-10-CM | POA: Insufficient documentation

## 2013-08-18 DIAGNOSIS — I25810 Atherosclerosis of coronary artery bypass graft(s) without angina pectoris: Secondary | ICD-10-CM | POA: Insufficient documentation

## 2013-08-18 DIAGNOSIS — E785 Hyperlipidemia, unspecified: Secondary | ICD-10-CM | POA: Insufficient documentation

## 2013-08-18 DIAGNOSIS — Z23 Encounter for immunization: Secondary | ICD-10-CM | POA: Insufficient documentation

## 2013-08-18 DIAGNOSIS — R7301 Impaired fasting glucose: Secondary | ICD-10-CM | POA: Insufficient documentation

## 2013-08-18 DIAGNOSIS — K219 Gastro-esophageal reflux disease without esophagitis: Secondary | ICD-10-CM | POA: Insufficient documentation

## 2013-08-18 NOTE — Assessment & Plan Note (Signed)
His lipid panel is almost perfect except for his HDL being a little low.  I encouraged him to stay on his current dosage of Lipitor until his sees his cardiologist.

## 2013-08-18 NOTE — Assessment & Plan Note (Signed)
His FBS is a little elevated.  He is to work on lowering his carbs.

## 2013-08-18 NOTE — Assessment & Plan Note (Addendum)
His BP is lower than it needs to be so we discussed him lowering his amlodipine dosage to 5 mg.  He'll see how his BP looks at his next cardiology appointment and will see how low they want his BP to be.

## 2013-08-18 NOTE — Assessment & Plan Note (Signed)
Refilled his Protonix.

## 2013-08-18 NOTE — Assessment & Plan Note (Signed)
Refilled his diclofenac 75 mg.

## 2013-08-18 NOTE — Assessment & Plan Note (Signed)
Jonathan Wall has been working harder on his diet and exercise and he wants to come back off of some of his medications.   I reminded him that he has done this in the past and then ends up needing another stent.  I encouraged him not to stop his medications until he sees his cardiologist.

## 2013-08-18 NOTE — Assessment & Plan Note (Signed)
The patient confirmed that they are not allergic to eggs and have never had a bad reaction with the flu shot in the past.  The vaccination was given without difficulty.   

## 2013-08-21 ENCOUNTER — Ambulatory Visit (INDEPENDENT_AMBULATORY_CARE_PROVIDER_SITE_OTHER): Payer: Medicare Other | Admitting: Family Medicine

## 2013-08-21 ENCOUNTER — Encounter: Payer: Self-pay | Admitting: Family Medicine

## 2013-08-21 VITALS — BP 131/77 | HR 71 | Resp 16 | Wt 204.0 lb

## 2013-08-21 DIAGNOSIS — Z01818 Encounter for other preprocedural examination: Secondary | ICD-10-CM

## 2013-08-21 DIAGNOSIS — I10 Essential (primary) hypertension: Secondary | ICD-10-CM

## 2013-08-21 NOTE — Progress Notes (Signed)
  Subjective:    Patient ID: Jonathan Wall, male    DOB: [birth year], 72 y.o.   MRN: [id number]  HPI  Jonathan Wall is here today at the request of Dr. Louis Matte for surgical clearance.  He will undergo left knee replacement on 10/15/2013.  He already has been cleared by his cardiologist Dr. Rudolpho Sevin.     Review of Systems  Constitutional: Negative.   HENT: Negative.   Eyes: Negative.   Respiratory: Negative.   Cardiovascular: Negative.   Gastrointestinal: Negative.   Endocrine: Negative.   Genitourinary: Negative.   Musculoskeletal: Negative.   Skin: Negative.   Allergic/Immunologic: Negative.   Neurological: Negative.   Hematological: Negative.   Psychiatric/Behavioral: Negative.      Past Medical History  Diagnosis Date  . CAD (coronary artery disease)   . Prostate cancer   . Hypertension   . Hyperlipidemia   . Osteoarthritis   . ED (erectile dysfunction)   . Hiatal hernia   . Colon polyps   . Barrett's esophagus     Clotest was negative, Dr. Myra Gianotti  . Esophagitis      Family History  Problem Relation Age of Onset  . Alzheimer's disease Mother   . Alzheimer's disease Father   . Heart disease Brother   . Alcohol abuse Brother   . Heart disease Brother     History   Social History Narrative   Marital Status: Married Chemical engineer)   Children:  Son Casimiro Needle)   Pets:  None   Living Situation: Lives with his wife.   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     Alcohol Use: None   Drug Use:  None   Diet:  Regular   Exercise: Minimal 1-2 x per month   Hobbies: Internet, Writing              Objective:   Physical Exam  Vitals reviewed. Constitutional: He appears well-developed and well-nourished.  Cardiovascular: Normal rate and regular rhythm.   Pulmonary/Chest: Effort normal and breath sounds normal.  Neurological: He is alert.  Skin: Skin is warm and  dry.  Psychiatric: He has a normal mood and affect.      Assessment & Plan:    Jonathan Wall was seen today for surgical clearance.  Diagnoses and associated orders for this visit:  Essential hypertension, benign  Preoperative clearance

## 2013-10-08 ENCOUNTER — Encounter: Payer: Self-pay | Admitting: Family Medicine

## 2013-11-10 DIAGNOSIS — Z01818 Encounter for other preprocedural examination: Secondary | ICD-10-CM | POA: Insufficient documentation

## 2013-11-13 ENCOUNTER — Ambulatory Visit (INDEPENDENT_AMBULATORY_CARE_PROVIDER_SITE_OTHER): Payer: Medicare Other | Admitting: Family Medicine

## 2013-11-13 ENCOUNTER — Encounter (INDEPENDENT_AMBULATORY_CARE_PROVIDER_SITE_OTHER): Payer: Self-pay

## 2013-11-13 ENCOUNTER — Encounter: Payer: Self-pay | Admitting: Family Medicine

## 2013-11-13 VITALS — BP 124/84 | HR 63 | Resp 16 | Wt 194.0 lb

## 2013-11-13 DIAGNOSIS — R059 Cough, unspecified: Secondary | ICD-10-CM

## 2013-11-13 DIAGNOSIS — R062 Wheezing: Secondary | ICD-10-CM

## 2013-11-13 DIAGNOSIS — R05 Cough: Secondary | ICD-10-CM

## 2013-11-13 MED ORDER — BENZONATATE 200 MG PO CAPS: 200.0000 mg | ORAL_CAPSULE | Freq: Three times a day (TID) | ORAL | Status: AC | PRN

## 2013-11-13 MED ORDER — ALBUTEROL SULFATE HFA 108 (90 BASE) MCG/ACT IN AERS: 2.0000 | INHALATION_SPRAY | Freq: Four times a day (QID) | RESPIRATORY_TRACT | Status: AC | PRN

## 2013-11-13 MED ORDER — HYDROCODONE-HOMATROPINE 5-1.5 MG/5ML PO SYRP: 5.0000 mL | ORAL_SOLUTION | Freq: Four times a day (QID) | ORAL | Status: DC | PRN

## 2013-11-13 NOTE — Patient Instructions (Signed)
1)  Chest Congestion/Cough -   Mucinex DM 1200/60 Tessalon Perles  Hydrocodone Pills vs Hycodan Vick's Vapor Rub Fisherman's Friend vs Ricola Honey Lemon with Echinacea   Cough, Adult  A cough is a reflex that helps clear your throat and airways. It can help heal the body or may be a reaction to an irritated airway. A cough may only last 2 or 3 weeks (acute) or may last more than 8 weeks (chronic).  CAUSES Acute cough:  Viral or bacterial infections. Chronic cough:  Infections.  Allergies.  Asthma.  Post-nasal drip.  Smoking.  Heartburn or acid reflux.  Some medicines.  Chronic lung problems (COPD).  Cancer. SYMPTOMS   Cough.  Fever.  Chest pain.  Increased breathing rate.  High-pitched whistling sound when breathing (wheezing).  Colored mucus that you cough up (sputum). TREATMENT   A bacterial cough may be treated with antibiotic medicine.  A viral cough must run its course and will not respond to antibiotics.  Your caregiver may recommend other treatments if you have a chronic cough. HOME CARE INSTRUCTIONS   Only take over-the-counter or prescription medicines for pain, discomfort, or fever as directed by your caregiver. Use cough suppressants only as directed by your caregiver.  Use a cold steam vaporizer or humidifier in your bedroom or home to help loosen secretions.  Sleep in a semi-upright position if your cough is worse at night.  Rest as needed.  Stop smoking if you smoke. SEEK IMMEDIATE MEDICAL CARE IF:   You have pus in your sputum.  Your cough starts to worsen.  You cannot control your cough with suppressants and are losing sleep.  You begin coughing up blood.  You have difficulty breathing.  You develop pain which is getting worse or is uncontrolled with medicine.  You have a fever. MAKE SURE YOU:   Understand these instructions.  Will watch your condition.  Will get help right away if you are not doing well or get  worse. Document Released: 03/19/2011 Document Revised: 12/13/2011 Document Reviewed: 03/19/2011 [facility] Patient Information 2014 [place].

## 2013-11-13 NOTE — Progress Notes (Signed)
Subjective:    Patient ID: Jonathan Wall, male    DOB: [birth year], 73 y.o.   MRN: [id number]  Norwin is here today with his wife [name]) complaining of URI symptoms. He has not tried taking OTC medications because he did not want to affect his blood pressure.     URI  This is a new problem. The current episode started in the past 7 days. The problem has been gradually worsening. There has been no fever. Associated symptoms include coughing, rhinorrhea, sneezing and wheezing. Treatments tried: Hydrocodone syrup and Nyquil  The treatment provided mild relief.    Review of Systems  HENT: Positive for rhinorrhea and sneezing.   Respiratory: Positive for cough, chest tightness and wheezing.   Psychiatric/Behavioral: Positive for sleep disturbance.     Past Medical History  Diagnosis Date  . CAD (coronary artery disease)   . Prostate cancer   . Hypertension   . Hyperlipidemia   . Osteoarthritis   . ED (erectile dysfunction)   . Hiatal hernia   . Colon polyps   . Barrett's esophagus     Clotest was negative, Dr. Bryn Gulling  . Esophagitis   . GERD (gastroesophageal reflux disease)      Past Surgical History  Procedure Laterality Date  . Coronary artery bypass graft  1997  . Angioplasty  08/21/2001    Stent- Mid RCA Dr. Atilano Median  . Prostate surgery      Radiation 2013 Dr. Thomasene Mohair  . Replacement total knee Left      History   Social History Narrative   Marital Status: Married [name])   Children:  Son [name])   Pets:  None   Living Situation: Lives with his wife.   Occupation: Retired Orthoptist - Chartered certified accountant)    Education: Programmer, systems   Tobacco Use/Exposure:  He smoked 1 ppd for 20 years.     Alcohol Use: None   Drug Use:  None   Diet:  Regular   Exercise: Minimal 1-2 x per month   Hobbies: Internet, Writing            Family History  Problem Relation Age of Onset  . Alzheimer's disease Mother   . Alzheimer's disease Father   . Heart disease  Brother   . Alcohol abuse Brother   . Heart disease Brother     No Known Allergies   Immunization History  Administered Date(s) Administered  . Influenza,inj,Quad PF,36+ Mos 06/14/2013  . Pneumococcal-Unspecified 03/06/2008  . Td 09/29/2006  . Tdap 08/02/2012  . Zoster 02/13/2008      Objective:   Physical Exam  Constitutional: He appears well-nourished. No distress.  HENT:  Mouth/Throat: No oropharyngeal exudate.  Eyes: Conjunctivae are normal.  Neck: Neck supple.  Cardiovascular: Normal rate, regular rhythm and normal heart sounds.   Pulmonary/Chest: Effort normal. No respiratory distress. He has wheezes (Mild). He has no rales.  Lymphadenopathy:    He has no cervical adenopathy.      Assessment & Plan:    Jaimon was seen today for uri.  Diagnoses and associated orders for this visit:  Cough - benzonatate (TESSALON) 200 MG capsule; Take 1 capsule (200 mg total) by mouth 3 (three) times daily as needed for cough. - HYDROcodone-homatropine (HYCODAN) 5-1.5 MG/5ML syrup; Take 5 mLs by mouth every 6 (six) hours as needed.  Wheezing - albuterol (PROAIR HFA) 108 (90 BASE) MCG/ACT inhaler; Inhale 2 puffs into the lungs every 6 (six) hours as needed for wheezing or shortness of  breath. - PR INHAL RX, AIRWAY OBST/DX SPUTUM INDUCT

## 2013-12-06 ENCOUNTER — Ambulatory Visit: Payer: Medicare Other | Admitting: Family Medicine

## 2013-12-20 ENCOUNTER — Encounter: Payer: Self-pay | Admitting: Family Medicine

## 2013-12-20 ENCOUNTER — Ambulatory Visit (INDEPENDENT_AMBULATORY_CARE_PROVIDER_SITE_OTHER): Payer: Medicare Other | Admitting: Family Medicine

## 2013-12-20 VITALS — BP 110/68 | HR 67 | Resp 16 | Wt 195.0 lb

## 2013-12-20 DIAGNOSIS — R42 Dizziness and giddiness: Secondary | ICD-10-CM

## 2013-12-20 MED ORDER — MECLIZINE HCL 25 MG PO TABS: 25.0000 mg | ORAL_TABLET | Freq: Three times a day (TID) | ORAL | Status: DC | PRN

## 2013-12-20 NOTE — Patient Instructions (Signed)
1)  Dizziness/Fluid in Ear - Take the Antivert up to 3 x per day for dizziness. Add some phenylephrine 10 mg (1-2 tabs) up to 3 x per day [Watch BP & Urinary Retention] if you don't notice an improvement with the phenylephrine you can try some pseudoephedrine 120 mg twice a day.  Look on YouTube for videos about maneuvers for dizziness.    2)  Cough - Finish the Biaxin and take cough meds as needed.  Dizziness Dizziness is a common problem. It is a feeling of unsteadiness or lightheadedness. You may feel like you are about to faint. Dizziness can lead to injury if you stumble or fall. A person of any age group can suffer from dizziness, but dizziness is more common in older adults. CAUSES  Dizziness can be caused by many different things, including:  Middle ear problems.  Standing for too long.  Infections.  An allergic reaction.  Aging.  An emotional response to something, such as the sight of blood.  Side effects of medicines.  Fatigue.  Problems with circulation or blood pressure.  Excess use of alcohol, medicines, or illegal drug use.  Breathing too fast (hyperventilation).  An arrhythmia or problems with your heart rhythm.  Low red blood cell count (anemia).  Pregnancy.  Vomiting, diarrhea, fever, or other illnesses that cause dehydration.  Diseases or conditions such as Parkinson's disease, high blood pressure (hypertension), diabetes, and thyroid problems.  Exposure to extreme heat. DIAGNOSIS  To find the cause of your dizziness, your caregiver may do a physical exam, lab tests, radiologic imaging scans, or an electrocardiography test (ECG).  TREATMENT  Treatment of dizziness depends on the cause of your symptoms and can vary greatly. HOME CARE INSTRUCTIONS   Drink enough fluids to keep your urine clear or pale yellow. This is especially important in very hot weather. In the elderly, it is also important in cold weather.  If your dizziness is caused by  medicines, take them exactly as directed. When taking blood pressure medicines, it is especially important to get up slowly.  Rise slowly from chairs and steady yourself until you feel okay.  In the morning, first sit up on the side of the bed. When this seems okay, stand slowly while holding onto something until you know your balance is fine.  If you need to stand in one place for a long time, be sure to move your legs often. Tighten and relax the muscles in your legs while standing.  If dizziness continues to be a problem, have someone stay with you for a day or two. Do this until you feel you are well enough to stay alone. Have the person call your caregiver if he or she notices changes in you that are concerning.  Do not drive or use heavy machinery if you feel dizzy.  Do not drink alcohol. SEEK IMMEDIATE MEDICAL CARE IF:   Your dizziness or lightheadedness gets worse.  You feel nauseous or vomit.  You develop problems with talking, walking, weakness, or using your arms, hands, or legs.  You are not thinking clearly or you have difficulty forming sentences. It may take a friend or family member to determine if your thinking is normal.  You develop chest pain, abdominal pain, shortness of breath, or sweating.  Your vision changes.  You notice any bleeding.  You have side effects from medicine that seems to be getting worse rather than better. MAKE SURE YOU:   Understand these instructions.  Will watch your  condition.  Will get help right away if you are not doing well or get worse. Document Released: 03/16/2001 Document Revised: 12/13/2011 Document Reviewed: 04/09/2011 [facility] Patient Information 2014 [place], [place].

## 2013-12-20 NOTE — Progress Notes (Signed)
Subjective:    Patient ID: Jonathan Wall, male    DOB: 11-Mar-1941, 73 y.o.   MRN: [id number]  HPI  Pinchas is here today with his wife because he is having some trouble with dizziness.  He has been struggling with a cold for almost a month. He was seen at the [facility] on 12/14/12.  He had an x-ray that showed a "spot" in one of his lungs.  He was told that he had pneumonia and was treated with clarithromycin.  His respiratory symptoms have improved and now he has the  Dizziness.  He says that his left ear feels full which he thinks has affected his equilibrium.  He had a few vomiting episodes yesterday which he feels is because of the dizziness.       Review of Systems  Constitutional: Negative for fatigue and unexpected weight change.  HENT: Negative.   Respiratory: Positive for cough (mildly productive). Negative for shortness of breath.   Cardiovascular: Negative for chest pain, palpitations and leg swelling.  Gastrointestinal: Positive for vomiting.  Genitourinary: Negative.   Musculoskeletal: Negative for myalgias.  Skin: Negative.   Neurological: Positive for dizziness.  Psychiatric/Behavioral: Negative.      Past Medical History  Diagnosis Date  . CAD (coronary artery disease)   . Prostate cancer   . Hypertension   . Hyperlipidemia   . Osteoarthritis   . ED (erectile dysfunction)   . Hiatal hernia   . Colon polyps   . Barrett's esophagus     Clotest was negative, Dr. Bryn Gulling  . Esophagitis   . GERD (gastroesophageal reflux disease)      Past Surgical History  Procedure Laterality Date  . Coronary artery bypass graft  1997  . Angioplasty  08/21/2001    Stent- Mid RCA Dr. Atilano Median  . Prostate surgery      Radiation 2013 Dr. Thomasene Mohair  . Replacement total knee Left      History   Social History Narrative   Marital Status: Married [name])   Children:  Son [name])   Pets:  None   Living Situation: Lives with his wife.   Occupation:  Retired Orthoptist - Chartered certified accountant)    Education: Programmer, systems   Tobacco Use/Exposure:  He smoked 1 ppd for 20 years.     Alcohol Use: None   Drug Use:  None   Diet:  Regular   Exercise: Minimal 1-2 x per month   Hobbies: Internet, Writing            Family History  Problem Relation Age of Onset  . Alzheimer's disease Mother   . Alzheimer's disease Father   . Heart disease Brother   . Alcohol abuse Brother   . Heart disease Brother      Current Outpatient Prescriptions on File Prior to Visit  Medication Sig Dispense Refill  . albuterol (PROAIR HFA) 108 (90 BASE) MCG/ACT inhaler Inhale 2 puffs into the lungs every 6 (six) hours as needed for wheezing or shortness of breath.  1 Inhaler  11   No current facility-administered medications on file prior to visit.     No Known Allergies   Immunization History  Administered Date(s) Administered  . Influenza,inj,Quad PF,36+ Mos 06/14/2013  . Pneumococcal-Unspecified 03/06/2008  . Td 09/29/2006  . Tdap 08/02/2012  . Zoster 02/13/2008       Objective:   Physical Exam  Constitutional: He is oriented to person, place, and time. He appears well-nourished. No distress.  HENT:  Head: Normocephalic.  Eyes: No scleral icterus.  Neck: Neck supple. No thyromegaly present.  Cardiovascular: Normal rate, regular rhythm and normal heart sounds.  Exam reveals no gallop and no friction rub.   No murmur heard. Pulmonary/Chest: Breath sounds normal. No respiratory distress. He exhibits no tenderness.  Musculoskeletal: He exhibits no edema.  Neurological: He is alert and oriented to person, place, and time.  Skin: Skin is warm and dry. No rash noted.  Psychiatric: He has a normal mood and affect. His behavior is normal. Judgment and thought content normal.      Assessment & Plan:    Jody was seen today for dizziness.  Diagnoses and associated orders for this visit:  Dizziness and giddiness  -   meclizine (ANTIVERT)  25 MG tablet; Take 1 tablet (25 mg total) by mouth 3 (three) times daily as needed for dizziness.

## 2014-01-01 ENCOUNTER — Other Ambulatory Visit: Payer: Self-pay | Admitting: *Deleted

## 2014-01-01 DIAGNOSIS — E785 Hyperlipidemia, unspecified: Secondary | ICD-10-CM

## 2014-01-01 DIAGNOSIS — I10 Essential (primary) hypertension: Secondary | ICD-10-CM

## 2014-01-01 DIAGNOSIS — R5383 Other fatigue: Secondary | ICD-10-CM

## 2014-01-01 DIAGNOSIS — R5381 Other malaise: Secondary | ICD-10-CM

## 2014-01-02 ENCOUNTER — Other Ambulatory Visit: Payer: Medicare Other

## 2014-01-02 LAB — COMPLETE METABOLIC PANEL WITH GFR
ALT: 21 U/L (ref 0–53)
AST: 24 U/L (ref 0–37)
Albumin: 3.8 g/dL (ref 3.5–5.2)
Alkaline Phosphatase: 103 U/L (ref 39–117)
BUN: 11 mg/dL (ref 6–23)
CO2: 26 mEq/L (ref 19–32)
Calcium: 9.4 mg/dL (ref 8.4–10.5)
Chloride: 103 mEq/L (ref 96–112)
Creat: 0.9 mg/dL (ref 0.50–1.35)
GFR, Est African American: >89 mL/min
GFR, Est Non African American: 84 mL/min
Glucose, Bld: 109 mg/dL — ABNORMAL HIGH (ref 70–99)
Potassium: 4.3 mEq/L (ref 3.5–5.3)
Sodium: 138 mEq/L (ref 135–145)
Total Bilirubin: 0.6 mg/dL (ref 0.2–1.2)
Total Protein: 6.3 g/dL (ref 6.0–8.3)

## 2014-01-02 LAB — TSH: TSH: 1.415 u[IU]/mL (ref 0.350–4.500)

## 2014-01-02 LAB — CBC WITH DIFFERENTIAL/PLATELET
Basophils Absolute: 0.1 10*3/uL (ref 0.0–0.1)
Basophils Relative: 2 % — ABNORMAL HIGH (ref 0–1)
Eosinophils Absolute: 0.3 10*3/uL (ref 0.0–0.7)
Eosinophils Relative: 6 % — ABNORMAL HIGH (ref 0–5)
HCT: 43.9 % (ref 39.0–52.0)
Hemoglobin: 14.8 g/dL (ref 13.0–17.0)
Lymphocytes Relative: 33 % (ref 12–46)
Lymphs Abs: 1.6 10*3/uL (ref 0.7–4.0)
MCH: 28.7 pg (ref 26.0–34.0)
MCHC: 33.7 g/dL (ref 30.0–36.0)
MCV: 85.1 fL (ref 78.0–100.0)
Monocytes Absolute: 0.6 10*3/uL (ref 0.1–1.0)
Monocytes Relative: 12 % (ref 3–12)
Neutro Abs: 2.2 10*3/uL (ref 1.7–7.7)
Neutrophils Relative %: 47 % (ref 43–77)
Platelets: 263 10*3/uL (ref 150–400)
RBC: 5.16 MIL/uL (ref 4.22–5.81)
RDW: 14.7 % (ref 11.5–15.5)
WBC: 4.7 10*3/uL (ref 4.0–10.5)

## 2014-01-02 LAB — LIPID PANEL
Cholesterol: 162 mg/dL (ref 0–200)
HDL: 38 mg/dL — ABNORMAL LOW (ref >39)
LDL Cholesterol: 101 mg/dL — ABNORMAL HIGH (ref 0–99)
Total CHOL/HDL Ratio: 4.3 Ratio
Triglycerides: 114 mg/dL (ref <150)
VLDL: 23 mg/dL (ref 0–40)

## 2014-01-03 LAB — MICROALBUMIN, URINE: Microalb, Ur: 3.19 mg/dL — ABNORMAL HIGH (ref 0.00–1.89)

## 2014-01-09 ENCOUNTER — Ambulatory Visit (INDEPENDENT_AMBULATORY_CARE_PROVIDER_SITE_OTHER): Payer: Medicare Other | Admitting: Family Medicine

## 2014-01-09 ENCOUNTER — Encounter: Payer: Self-pay | Admitting: Family Medicine

## 2014-01-09 VITALS — BP 125/74 | HR 71 | Resp 16 | Wt 194.0 lb

## 2014-01-09 DIAGNOSIS — E785 Hyperlipidemia, unspecified: Secondary | ICD-10-CM

## 2014-01-09 DIAGNOSIS — K219 Gastro-esophageal reflux disease without esophagitis: Secondary | ICD-10-CM

## 2014-01-09 DIAGNOSIS — I25810 Atherosclerosis of coronary artery bypass graft(s) without angina pectoris: Secondary | ICD-10-CM

## 2014-01-09 DIAGNOSIS — R809 Proteinuria, unspecified: Secondary | ICD-10-CM

## 2014-01-09 DIAGNOSIS — I10 Essential (primary) hypertension: Secondary | ICD-10-CM

## 2014-01-09 DIAGNOSIS — M1990 Unspecified osteoarthritis, unspecified site: Secondary | ICD-10-CM

## 2014-01-09 MED ORDER — BENAZEPRIL HCL 20 MG PO TABS: 20.0000 mg | ORAL_TABLET | Freq: Every day | ORAL | Status: AC

## 2014-01-09 MED ORDER — DICLOFENAC SODIUM 75 MG PO TBEC: 75.0000 mg | DELAYED_RELEASE_TABLET | Freq: Two times a day (BID) | ORAL | Status: AC

## 2014-01-09 MED ORDER — PANTOPRAZOLE SODIUM 40 MG PO TBEC: 40.0000 mg | DELAYED_RELEASE_TABLET | Freq: Every day | ORAL | Status: AC

## 2014-01-09 MED ORDER — CLOPIDOGREL BISULFATE 75 MG PO TABS: 75.0000 mg | ORAL_TABLET | Freq: Every day | ORAL | Status: AC

## 2014-01-09 MED ORDER — AMLODIPINE BESYLATE 5 MG PO TABS: 5.0000 mg | ORAL_TABLET | Freq: Every day | ORAL | Status: AC

## 2014-01-09 MED ORDER — METOPROLOL TARTRATE 25 MG PO TABS: 25.0000 mg | ORAL_TABLET | Freq: Two times a day (BID) | ORAL | Status: AC

## 2014-01-09 MED ORDER — NIACIN ER (ANTIHYPERLIPIDEMIC) 1000 MG PO TBCR: 1000.0000 mg | EXTENDED_RELEASE_TABLET | Freq: Every day | ORAL | Status: AC

## 2014-01-09 MED ORDER — ATORVASTATIN CALCIUM 40 MG PO TABS: 40.0000 mg | ORAL_TABLET | Freq: Every day | ORAL | Status: AC

## 2014-01-09 NOTE — Progress Notes (Signed)
Subjective:    Patient ID: Jonathan Wall, male    DOB: 12-22-1940, 73 y.o.   MRN: [id number]  HPI  Jonathan Wall is here today to go over his recent lab results and the following issues:  1)  Hypertension:  He is taking Lopressor (25 BID), Norvasc(5 mg) and Lotensin (20 mg) which he feels control his pressure very well.  He needs to have them refilled.     2)  Hyperlipidemia:  He denies any side effects on Niacin ER (1000 mg) and Lipitor (40 mg) and needs them refilled.    3)  GERD:  His GERD symptoms are controlled on  Protonix (40 mg) daily.     4)  CAD:  He is doing fine on Plavix 75 mg daily.    5)  Osteoarthritis:  He is taking Diclofenac 75 mg daily. He was taking it twice a day when he was doing rehab on his left knee.  Since his knee pain has improved, he is down to 1 pill daily.     Review of Systems  Constitutional: Negative for activity change, appetite change and fatigue.  Cardiovascular: Negative for chest pain, palpitations and leg swelling.  Psychiatric/Behavioral: Negative for behavioral problems and sleep disturbance. The patient is not nervous/anxious.   All other systems reviewed and are negative.    Past Medical History  Diagnosis Date  . CAD (coronary artery disease)   . Prostate cancer   . Hypertension   . Hyperlipidemia   . Osteoarthritis   . ED (erectile dysfunction)   . Hiatal hernia   . Colon polyps   . Barrett's esophagus     Clotest was negative, Dr. Bryn Gulling  . Esophagitis   . GERD (gastroesophageal reflux disease)      Past Surgical History  Procedure Laterality Date  . Coronary artery bypass graft  1997  . Angioplasty  08/21/2001    Stent- Mid RCA Dr. Atilano Median  . Prostate surgery      Radiation 2013 Dr. Thomasene Mohair  . Replacement total knee Left      History   Social History Narrative   Marital Status: Married [name])   Children:  Son [name])   Pets:  None   Living Situation: Lives with his wife.   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     Alcohol Use: None   Drug Use:  None   Diet:  Regular   Exercise: Minimal 1-2 x per month   Hobbies: Internet, Writing            Family History  Problem Relation Age of Onset  . Alzheimer's disease Mother   . Alzheimer's disease Father   . Heart disease Brother   . Alcohol abuse Brother   . Heart disease Brother      Current Outpatient Prescriptions on File Prior to Visit  Medication Sig Dispense Refill  . albuterol (PROAIR HFA) 108 (90 BASE) MCG/ACT inhaler Inhale 2 puffs into the lungs every 6 (six) hours as needed for wheezing or shortness of breath.  1 Inhaler  11   No current facility-administered medications on file prior to visit.     No Known Allergies   Immunization History  Administered Date(s) Administered  . Influenza,inj,Quad PF,36+ Mos 06/14/2013  . Pneumococcal-Unspecified 03/06/2008  . Td 09/29/2006  . Tdap 08/02/2012  . Zoster 02/13/2008       Objective:  Physical Exam  Nursing note and vitals reviewed. Constitutional: He appears well-developed and well-nourished.  Cardiovascular: Normal rate and regular rhythm.   Pulmonary/Chest: Effort normal and breath sounds normal.  Neurological: He is alert.  Skin: Skin is warm and dry.  Psychiatric: He has a normal mood and affect.      Assessment & Plan:    Efrain was seen today for medication management.  His  medical conditions are stable on his current medications. He will remain on them.    Diagnoses and associated orders for this visit:  Essential hypertension, benign - metoprolol tartrate (LOPRESSOR) 25 MG tablet; Take 1 tablet (25 mg total) by mouth 2 (two) times daily. - amLODipine (NORVASC) 5 MG tablet; Take 1 tablet (5 mg total) by mouth daily. - benazepril (LOTENSIN) 20 MG tablet; Take 1 tablet (20 mg total) by mouth daily.  Other and unspecified  hyperlipidemia - niacin (NIASPAN) 1000 MG CR tablet; Take 1 tablet (1,000 mg total) by mouth at bedtime. Take 2 tabs po QHS - atorvastatin (LIPITOR) 40 MG tablet; Take 1 tablet (40 mg total) by mouth daily.  GERD (gastroesophageal reflux disease) - pantoprazole (PROTONIX) 40 MG tablet; Take 1 tablet (40 mg total) by mouth daily.  CAD (coronary artery disease) of artery bypass graft - clopidogrel (PLAVIX) 75 MG tablet; Take 1 tablet (75 mg total) by mouth daily.  Microalbuminuria - Microalbumin, urine; Future  Osteoarthritis - diclofenac (VOLTAREN) 75 MG EC tablet; Take 1 tablet (75 mg total) by mouth 2 (two) times daily.   TIME SPENT "FACE TO FACE" WITH PATIENT -  30 MINS

## 2014-01-09 NOTE — Patient Instructions (Addendum)
1)  BP - Perfect on your current regimen so continue on these medications.  2)  Cholesterol - Remain on the combination of Niaspan 1000 mg and atorvastatin 40 mg.  3)  Microalbuminuria - Try to decrease your intake of animal protein. Increase intake of water.  We'll recheck your level in 3 months.    4)  Blood Sugar - Try to limit your intake of sweets/bad carbs.     Insulin Resistance Blood sugar (glucose) levels are controlled by a hormone called insulin. Insulin is made by your pancreas. When your blood glucose goes up, insulin is released into your blood. Insulin is required for your body to function normally. However, your body can become resistant to your own insulin or to insulin given to treat diabetes. In either case, insulin resistance can lead to serious problems. These problems include:  Type 2 diabetes.  Heart disease.  High blood pressure.  Stroke.  Polycystic ovary syndrome.  Fatty liver. CAUSES  Insulin resistance can develop for many different reasons. It is more likely to happen in people with these conditions or characteristics:  Obesity.  Inactivity.  Pregnancy.  High blood pressure.  Stress.  Steroid use.  Infection or severe illness.  Increased levels of cholesterol and triglycerides. SYMPTOMS  There are no symptoms. You may have symptoms related to the various complications of insulin resistance.  DIAGNOSIS  Several different things can make your caregiver suspect you have insulin resistance. These include:  High blood glucose (hyperglycemia).  Abnormal cholesterol levels.  High uric acid levels.  Changes related to blood pressure.  Changes related to inflammation. Insulin resistance can be determined with blood tests. An elevated insulin level when you have not eaten might suggest resistance. Other more complicated tests are sometimes necessary. TREATMENT  Lifestyle changes are the most important treatment for insulin resistance.   If  you are overweight and you have insulin resistance, you can improve your insulin sensitivity by losing weight.  Moderate exercise for 30 40 minutes, 4 days a week, can improve insulin sensitivity. Some medicines can also help improve your insulin sensitivity. Your caregiver can discuss these with you if they are appropriate.  HOME CARE INSTRUCTIONS   Do not smoke.  Keep your weight at a healthy level.  Get exercise.  If you have diabetes, follow your caregiver's directions.  If you have high blood pressure, follow your caregiver's directions.  Only take prescription medicines for pain, fever, or discomfort as directed by your caregiver. SEEK MEDICAL CARE IF:   You are diabetic and you are having problems keeping your blood glucose levels at target range.  You are having episodes of low blood glucose (hypoglycemia).  You feel you might be having side effects from your medicines.  You have symptoms of an illness that is not improving after 3 4 days.  You have a sore or wound that is not healing.  You notice a change in vision or a new problem with your vision. SEEK IMMEDIATE MEDICAL CARE IF:   Your blood glucose goes below 70, especially if you have confusion, lightheadedness, or other symptoms with it.  Your blood glucose is very high (as advised by your caregiver) twice in a row.  You pass out.  You have chest pain or trouble breathing.  You have a sudden, severe headache.  You have sudden weakness in one arm or one leg.  You have sudden difficulty speaking or swallowing.  You develop vomiting or diarrhea that is getting worse or not  improving after 1 day. Document Released: 11/09/2005 Document Revised: 03/21/2012 Document Reviewed: 03/01/2013 [facility] Patient Information 2014 [place], [place].  Proteinuria Proteinuria is a condition in which urine contains more protein than is normal. Proteinuria is either a sign that your body is producing too much protein or a  sign that there is a problem with the kidneys. Healthy kidneys prevent most substances that the body needs, including proteins, from leaving the bloodstream and ending up in urine. CAUSES  Proteinuria may be caused by a temporary event or condition such as stress, exercise, or fever, and go away on its own. Proteinuria may also be a symptom of a more serious condition or disease. Causes of proteinuria include:  A kidney disease caused by:  Diabetes.  High blood pressure (hypertension).   A disease that affects the immune system, such as lupus.  A genetic disease, such as Alport's syndrome.  Medicines that damage the kidneys, such as long-term nonsteroidal anti-inflammatory drugs (NSAIDs).  Poisoning or exposure to toxic substances.  A reoccurring kidney or urinary infection.  Excess protein production in the body caused by:  Multiple myeloma.  Amyloidosis. SYMPTOMS You may have proteinuria without having noticeable symptoms. If there is a large amount of protein in your urine, your urine may look foamy. You may also notice swelling (edema) in your hands, feet, abdomen, or face. DIAGNOSIS To determine whether you have proteinuria, you will need to provide a urine sample. Your urine will then be tested for too much protein and the main blood protein albumin. If your test shows that you have proteinuria, you may need to take additional tests to determine its cause, how much protein is in your urine, and what type of protein is being lost. Tests may include:  Blood tests.  Urine tests.  A blood pressure measurement.  Imaging tests. TREATMENT  Treatment will depend on the cause of your proteinuria. Your caregiver will discuss treatment options with you after you have been diagnosed. If your proteinuria is mild or temporary, no treatment may be necessary. HOME CARE INSTRUCTIONS Ask your caregiver if monitoring the level of protein in your urine at home using simple testing strips  is appropriate for you. Early detection of proteinuria can lead to early and often successful treatment of the condition causing it. Document Released: 11/10/2005 Document Revised: 06/14/2012 Document Reviewed: 02/18/2012 [facility] Patient Information 2014 [place].

## 2014-02-27 IMAGING — CR DG HUMERUS 2V *L*
2 series · 2 of 2 positions shown · non-contrast
Comparison: None.

CLINICAL DATA: Bulging soft tissue area in the anterior arm after
lifting and color

LEFT HUMERUS - 2+ VIEW

[w humerus ap left *]
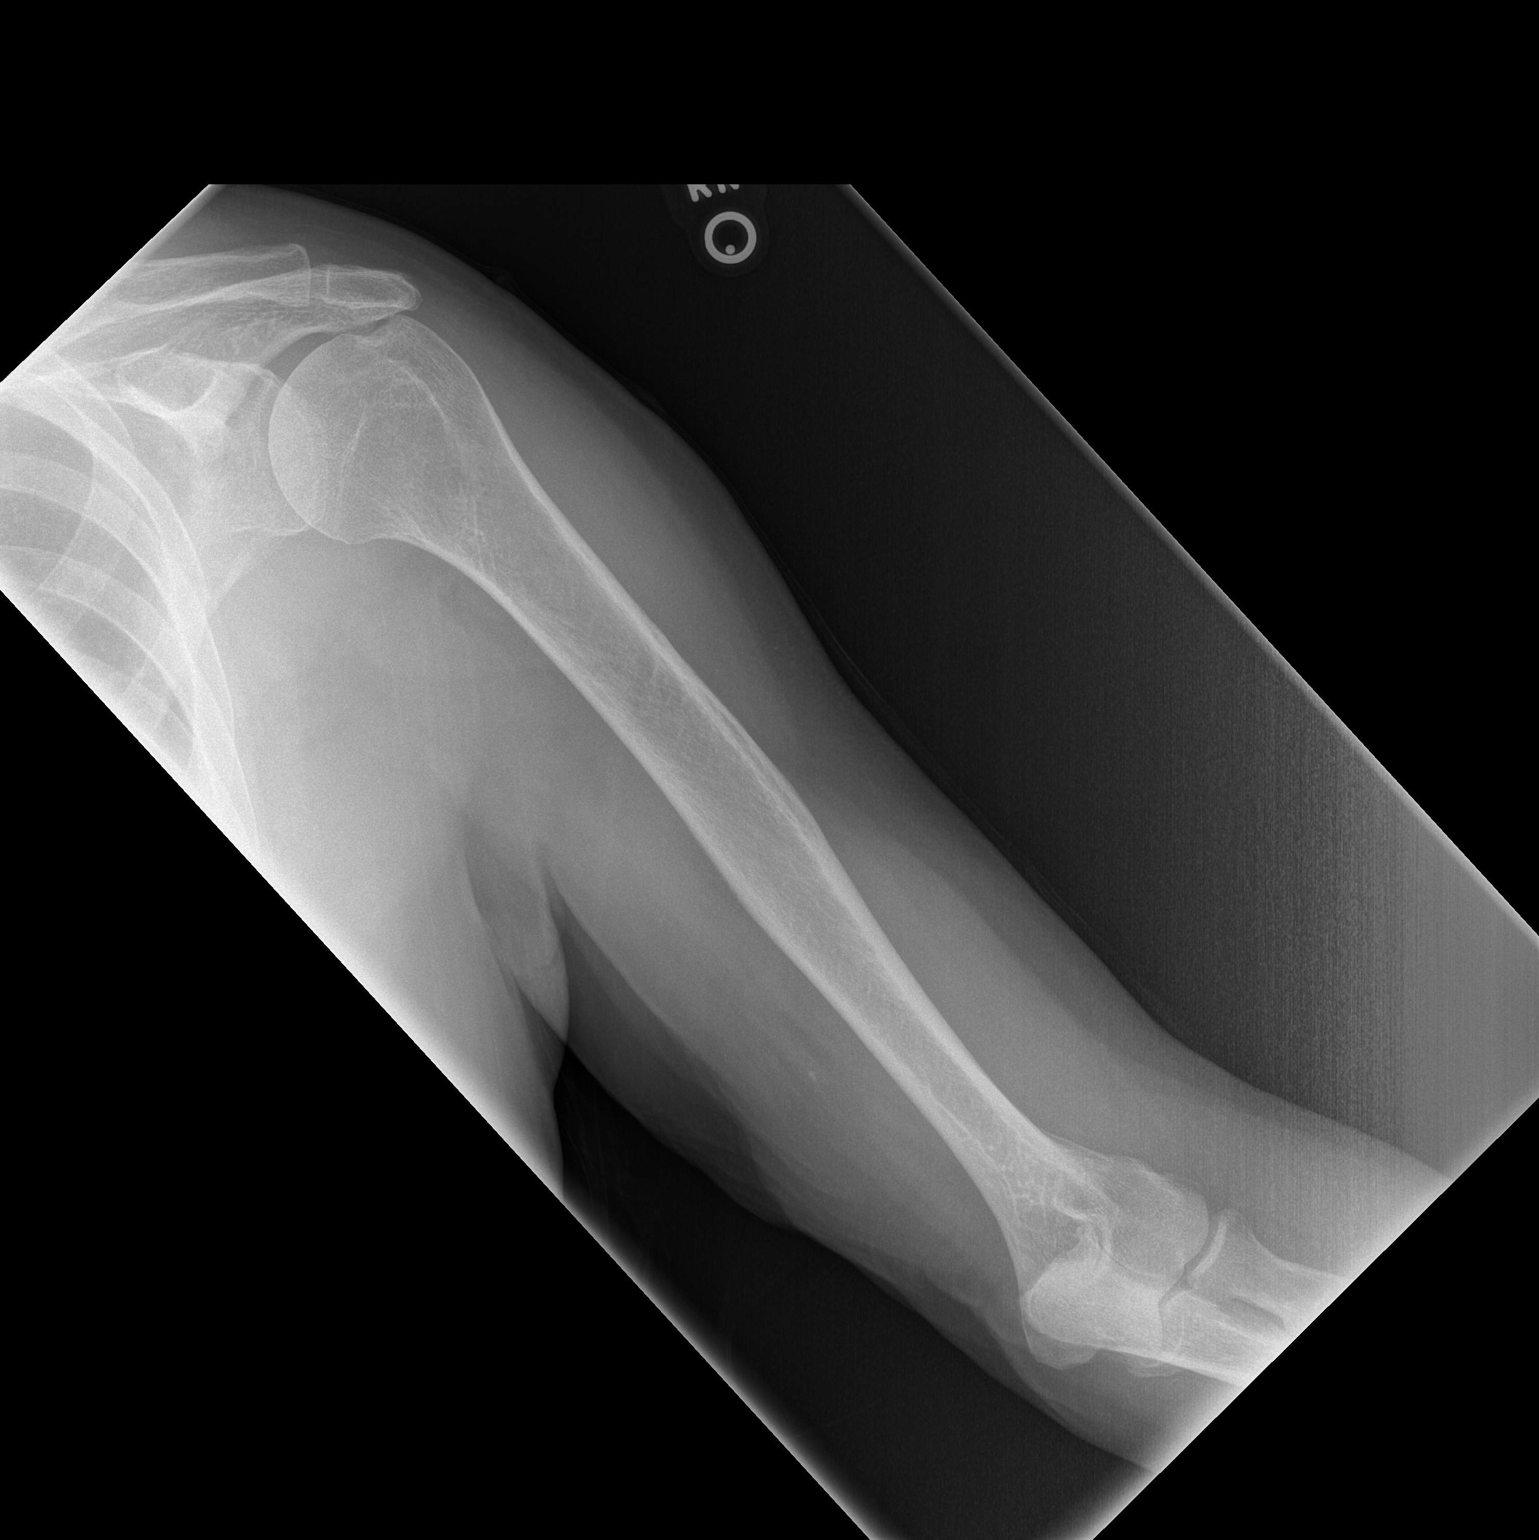

[w humerus lat left *]
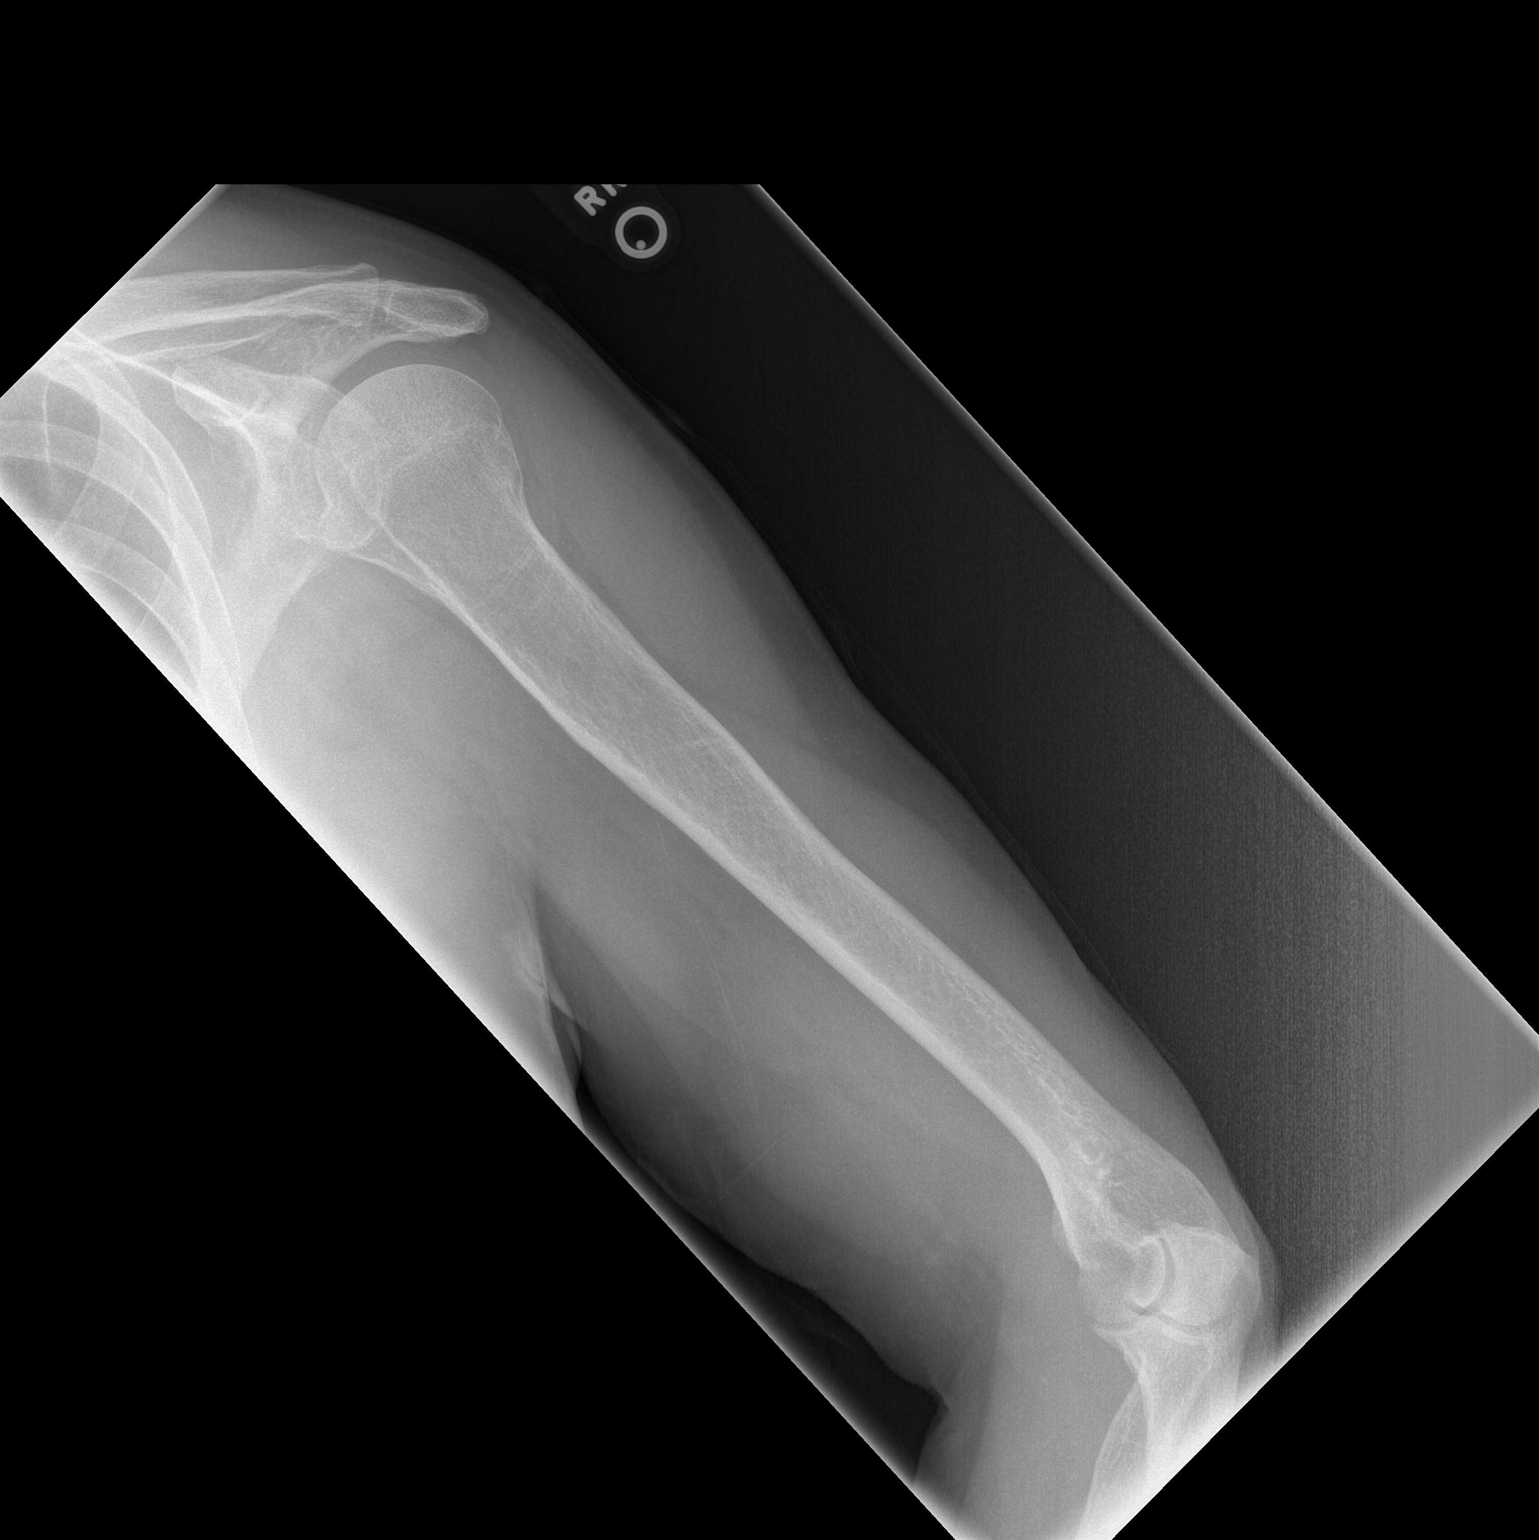

[2 of 2 positions shown; findings below may reference images not displayed]

FINDINGS: No fracture or bone lesion.  The shoulder and elbow
joints are normally aligned.

The soft tissues are unremarkable.
IMPRESSION: No fracture or bone lesion.  No dislocation.

If further imaging of the arm soft tissue abnormality is desired
clinically, this would be best performed with MRI.

## 2014-02-28 ENCOUNTER — Ambulatory Visit (INDEPENDENT_AMBULATORY_CARE_PROVIDER_SITE_OTHER): Payer: Medicare Other | Admitting: Family Medicine

## 2014-02-28 ENCOUNTER — Ambulatory Visit (HOSPITAL_BASED_OUTPATIENT_CLINIC_OR_DEPARTMENT_OTHER)
Admission: RE | Admit: 2014-02-28 | Discharge: 2014-02-28 | Disposition: A | Discharge status: Home / Self Care | Payer: Medicare Other | Source: Ambulatory Visit | Attending: Family Medicine | Admitting: Family Medicine

## 2014-02-28 ENCOUNTER — Encounter: Payer: Self-pay | Admitting: Family Medicine

## 2014-02-28 VITALS — BP 130/71 | HR 78 | Temp 98.2°F | Resp 16 | Ht 68.0 in | Wt 198.0 lb

## 2014-02-28 DIAGNOSIS — R059 Cough, unspecified: Secondary | ICD-10-CM | POA: Insufficient documentation

## 2014-02-28 DIAGNOSIS — R05 Cough: Secondary | ICD-10-CM

## 2014-02-28 DIAGNOSIS — R0989 Other specified symptoms and signs involving the circulatory and respiratory systems: Secondary | ICD-10-CM

## 2014-02-28 DIAGNOSIS — Z87891 Personal history of nicotine dependence: Secondary | ICD-10-CM | POA: Insufficient documentation

## 2014-02-28 DIAGNOSIS — J0100 Acute maxillary sinusitis, unspecified: Secondary | ICD-10-CM

## 2014-02-28 MED ORDER — PREDNISONE 50 MG PO TABS: 50.0000 mg | ORAL_TABLET | Freq: Every day | ORAL | Status: AC

## 2014-02-28 MED ORDER — BENZONATATE 200 MG PO CAPS: 200.0000 mg | ORAL_CAPSULE | Freq: Three times a day (TID) | ORAL | Status: DC | PRN

## 2014-02-28 MED ORDER — CEFDINIR 300 MG PO CAPS: 300.0000 mg | ORAL_CAPSULE | Freq: Two times a day (BID) | ORAL | Status: DC

## 2014-02-28 MED ORDER — HYDROCODONE-HOMATROPINE 5-1.5 MG/5ML PO SYRP: 5.0000 mL | ORAL_SOLUTION | Freq: Three times a day (TID) | ORAL | Status: DC | PRN

## 2014-02-28 NOTE — Patient Instructions (Signed)
1)  Head Congestion - Zyrtec D 5/120 twice a day; [place] Cold Care   2)  Chest Congestion/Cough - Mucinex DM 1200/60 twice a day plus Tessalon Perles plus syrup if needed. Use your inhaler 2 puffs up to 4 x per day.  Prednisone 50 mg daily for 5 days.    3)  Ear/Sinus/Chest - Omnicef twice a day for 10 days   Bronchitis Bronchitis is inflammation of the airways that extend from the windpipe into the lungs (bronchi). The inflammation often causes mucus to develop, which leads to a cough. If the inflammation becomes severe, it may cause shortness of breath. CAUSES  Bronchitis may be caused by:   Viral infections.   Bacteria.   Cigarette smoke.   Allergens, pollutants, and other irritants.  SIGNS AND SYMPTOMS  The most common symptom of bronchitis is a frequent cough that produces mucus. Other symptoms include:  Fever.   Body aches.   Chest congestion.   Chills.   Shortness of breath.   Sore throat.  DIAGNOSIS  Bronchitis is usually diagnosed through a medical history and physical exam. Tests, such as chest X-rays, are sometimes done to rule out other conditions.  TREATMENT  You may need to avoid contact with whatever caused the problem (smoking, for example). Medicines are sometimes needed. These may include:  Antibiotics. These may be prescribed if the condition is caused by bacteria.  Cough suppressants. These may be prescribed for relief of cough symptoms.   Inhaled medicines. These may be prescribed to help open your airways and make it easier for you to breathe.   Steroid medicines. These may be prescribed for those with recurrent (chronic) bronchitis. HOME CARE INSTRUCTIONS  Get plenty of rest.   Drink enough fluids to keep your urine clear or pale yellow (unless you have a medical condition that requires fluid restriction). Increasing fluids may help thin your secretions and will prevent dehydration.   Only take over-the-counter or prescription  medicines as directed by your health care provider.  Only take antibiotics as directed. Make sure you finish them even if you start to feel better.  Avoid secondhand smoke, irritating chemicals, and strong fumes. These will make bronchitis worse. If you are a smoker, quit smoking. Consider using nicotine gum or skin patches to help control withdrawal symptoms. Quitting smoking will help your lungs heal faster.   Put a cool-mist humidifier in your bedroom at night to moisten the air. This may help loosen mucus. Change the water in the humidifier daily. You can also run the hot water in your shower and sit in the bathroom with the door closed for 5 10 minutes.   Follow up with your health care provider as directed.   Wash your hands frequently to avoid catching bronchitis again or spreading an infection to others.  SEEK MEDICAL CARE IF: Your symptoms do not improve after 1 week of treatment.  SEEK IMMEDIATE MEDICAL CARE IF:  Your fever increases.  You have chills.   You have chest pain.   You have worsening shortness of breath.   You have bloody sputum.  You faint.  You have lightheadedness.  You have a severe headache.   You vomit repeatedly. MAKE SURE YOU:   Understand these instructions.  Will watch your condition.  Will get help right away if you are not doing well or get worse. Document Released: 09/20/2005 Document Revised: 07/11/2013 Document Reviewed: 05/15/2013 [facility] Patient Information 2014 [place].

## 2014-02-28 NOTE — Progress Notes (Signed)
Subjective:    Patient ID: Jonathan Wall, male    DOB: 01/20/1941, 73 y.o.   MRN: [id number]  HPI  Jonathan Wall is here today complaining of head and chest congestion with a cough. He also feels that his left ear is blocked and congested as well.  He has had these symptoms for about 7 days. He describes his symptoms as moderate to severe and feels that his symptoms are just not improving. He has taken OTC nasal decongestants which have not helped.    Review of Systems  Constitutional: Negative for activity change, appetite change and fatigue.  HENT: Positive for congestion, ear pain, postnasal drip and voice change.   Eyes: Positive for itching. Negative for discharge.  Respiratory: Positive for cough.   Psychiatric/Behavioral: Negative for behavioral problems and sleep disturbance. The patient is nervous/anxious.   All other systems reviewed and are negative.    Past Medical History  Diagnosis Date  . CAD (coronary artery disease)   . Prostate cancer   . Hypertension   . Hyperlipidemia   . Osteoarthritis   . ED (erectile dysfunction)   . Hiatal hernia   . Colon polyps   . Barrett's esophagus     Clotest was negative, Dr. Bryn Gulling  . Esophagitis   . GERD (gastroesophageal reflux disease)      Past Surgical History  Procedure Laterality Date  . Coronary artery bypass graft  1997  . Angioplasty  08/21/2001    Stent- Mid RCA Dr. Atilano Median  . Prostate surgery      Radiation 2013 Dr. Thomasene Mohair  . Replacement total knee Left      History   Social History Narrative   Marital Status: Married [name])   Children:  Son [name])   Pets:  None   Living Situation: Lives with his wife.   Occupation: Retired Orthoptist - Chartered certified accountant)    Education: Programmer, systems   Tobacco Use/Exposure:  He smoked 1 ppd for 20 years.     Alcohol Use: None   Drug Use:  None   Diet:  Regular   Exercise: Minimal 1-2 x per month   Hobbies: Internet, Writing            Family History   Problem Relation Age of Onset  . Alzheimer's disease Mother   . Alzheimer's disease Father   . Heart disease Brother   . Alcohol abuse Brother   . Heart disease Brother      Current Outpatient Prescriptions on File Prior to Visit  Medication Sig Dispense Refill  . albuterol (PROAIR HFA) 108 (90 BASE) MCG/ACT inhaler Inhale 2 puffs into the lungs every 6 (six) hours as needed for wheezing or shortness of breath.  1 Inhaler  11  . amLODipine (NORVASC) 5 MG tablet Take 1 tablet (5 mg total) by mouth daily.  90 tablet  1  . atorvastatin (LIPITOR) 40 MG tablet Take 1 tablet (40 mg total) by mouth daily.  90 tablet  1  . benazepril (LOTENSIN) 20 MG tablet Take 1 tablet (20 mg total) by mouth daily.  90 tablet  1  . clopidogrel (PLAVIX) 75 MG tablet Take 1 tablet (75 mg total) by mouth daily.  90 tablet  1  . diclofenac (VOLTAREN) 75 MG EC tablet Take 1 tablet (75 mg total) by mouth 2 (two) times daily.  180 tablet  1  . metoprolol tartrate (LOPRESSOR) 25 MG tablet Take 1 tablet (25 mg total) by mouth 2 (two) times daily.  180 tablet  1  . niacin (NIASPAN) 1000 MG CR tablet Take 1 tablet (1,000 mg total) by mouth at bedtime. Take 2 tabs po QHS  90 tablet  1  . pantoprazole (PROTONIX) 40 MG tablet Take 1 tablet (40 mg total) by mouth daily.  90 tablet  3   No current facility-administered medications on file prior to visit.     No Known Allergies   Immunization History  Administered Date(s) Administered  . Influenza,inj,Quad PF,36+ Mos 06/14/2013  . Pneumococcal-Unspecified 03/06/2008  . Td 09/29/2006  . Tdap 08/02/2012  . Zoster 02/13/2008       Objective:   Physical Exam  Constitutional: He appears well-nourished. No distress.  HENT:  Mouth/Throat: No oropharyngeal exudate.  Eyes: Conjunctivae are normal.  Neck: Neck supple.  Cardiovascular: Normal rate, regular rhythm and normal heart sounds.   Pulmonary/Chest: Effort normal. No respiratory distress. He has no rales.    Lymphadenopathy:    He has no cervical adenopathy.  Skin: Skin is dry.  Psychiatric: He has a normal mood and affect. His behavior is normal. Judgment and thought content normal.       Assessment & Plan:    Jonathan Wall was seen today for nasal congestion, chest congestion and cough.  He was given medication to control his symptoms.    Diagnoses and associated orders for this visit:  Chest congestion - DG Chest 2 View (Normal)  - predniSONE (DELTASONE) 50 MG tablet; Take 1 tablet (50 mg total) by mouth daily with breakfast.  Cough - DG Chest 2 View - benzonatate (TESSALON) 200 MG capsule; Take 1 capsule (200 mg total) by mouth 3 (three) times daily as needed for cough. - HYDROcodone-homatropine (HYCODAN) 5-1.5 MG/5ML syrup; Take 5 mLs by mouth every 8 (eight) hours as needed for cough.  Sinusitis - cefdinir (OMNICEF) 300 MG capsule; Take 1 capsule (300 mg total) by mouth 2 (two) times daily. Take for 10 days

## 2014-03-08 ENCOUNTER — Ambulatory Visit (INDEPENDENT_AMBULATORY_CARE_PROVIDER_SITE_OTHER): Payer: Medicare Other | Admitting: Family Medicine

## 2014-03-08 ENCOUNTER — Encounter: Payer: Self-pay | Admitting: Family Medicine

## 2014-03-08 VITALS — BP 142/81 | HR 75 | Resp 16 | Ht 68.25 in | Wt 196.0 lb

## 2014-03-08 DIAGNOSIS — R062 Wheezing: Secondary | ICD-10-CM

## 2014-03-08 DIAGNOSIS — R05 Cough: Secondary | ICD-10-CM

## 2014-03-08 DIAGNOSIS — J209 Acute bronchitis, unspecified: Secondary | ICD-10-CM

## 2014-03-08 DIAGNOSIS — Z01818 Encounter for other preprocedural examination: Secondary | ICD-10-CM

## 2014-03-08 DIAGNOSIS — R059 Cough, unspecified: Secondary | ICD-10-CM

## 2014-03-08 LAB — EKG 12-LEAD

## 2014-03-08 MED ORDER — AZITHROMYCIN 500 MG PO TABS: 500.0000 mg | ORAL_TABLET | Freq: Every day | ORAL | Status: AC

## 2014-03-08 NOTE — Patient Instructions (Addendum)
1)  Head/Ear Congestion -   Zyrtec D 5/120 mg:  Take 1 tab twice a day. Umcka Cold Care:  2 droppers 4 x per day Milta Deiters Med Sinus Rinse:  "Ear Popper Procedure"  Swallow a mouthful of water at the same time as you force air into one nostril while keeping the other nostril closed.  Nasonex:  After you have gently cleaned out the nasal passage with a normal saline spray like Simply Saline then spray 2 sprays into each nostril toward the outside as directed at bedtime.  2)  Chest/Cough -  Replace the Ventolin with Combivent - 1 puff 4 x per day if needed Asmanex - 1 puff at bedtime  Zithromax - 500 mg daily (1 pill) for 3 days - it is in your system for 10 days.      Azithromycin tablets What is this medicine? AZITHROMYCIN (az ith roe MYE sin) is a macrolide antibiotic. It is used to treat or prevent certain kinds of bacterial infections. It will not work for colds, flu, or other viral infections. This medicine may be used for other purposes; ask your health care provider or pharmacist if you have questions. COMMON BRAND NAME(S): Zithromax Tri-Pak, Zithromax Z-Pak, Zithromax What should I tell my health care provider before I take this medicine? They need to know if you have any of these conditions: -kidney disease -liver disease -irregular heartbeat or heart disease -an unusual or allergic reaction to azithromycin, erythromycin, other macrolide antibiotics, foods, dyes, or preservatives -pregnant or trying to get pregnant -breast-feeding How should I use this medicine? Take this medicine by mouth with a full glass of water. Follow the directions on the prescription label. The tablets can be taken with food or on an empty stomach. If the medicine upsets your stomach, take it with food. Take your medicine at regular intervals. Do not take your medicine more often than directed. Take all of your medicine as directed even if you think your are better. Do not skip doses or stop your medicine  early. Talk to your pediatrician regarding the use of this medicine in children. Special care may be needed. Overdosage: If you think you have taken too much of this medicine contact a poison control center or emergency room at once. NOTE: This medicine is only for you. Do not share this medicine with others. What if I miss a dose? If you miss a dose, take it as soon as you can. If it is almost time for your next dose, take only that dose. Do not take double or extra doses. What may interact with this medicine? Do not take this medicine with any of the following medications: -lincomycin This medicine may also interact with the following medications: -amiodarone -antacids -cyclosporine -digoxin -magnesium -nelfinavir -phenytoin -warfarin This list may not describe all possible interactions. Give your health care provider a list of all the medicines, herbs, non-prescription drugs, or dietary supplements you use. Also tell them if you smoke, drink alcohol, or use illegal drugs. Some items may interact with your medicine. What should I watch for while using this medicine? Tell your doctor or health care professional if your symptoms do not improve. Do not treat diarrhea with over the counter products. Contact your doctor if you have diarrhea that lasts more than 2 days or if it is severe and watery. This medicine can make you more sensitive to the sun. Keep out of the sun. If you cannot avoid being in the sun, wear protective clothing and  use sunscreen. Do not use sun lamps or tanning beds/booths. What side effects may I notice from receiving this medicine? Side effects that you should report to your doctor or health care professional as soon as possible: -allergic reactions like skin rash, itching or hives, swelling of the face, lips, or tongue -confusion, nightmares or hallucinations -dark urine -difficulty breathing -hearing loss -irregular heartbeat or chest pain -pain or difficulty  passing urine -redness, blistering, peeling or loosening of the skin, including inside the mouth -white patches or sores in the mouth -yellowing of the eyes or skin Side effects that usually do not require medical attention (report to your doctor or health care professional if they continue or are bothersome): -diarrhea -dizziness, drowsiness -headache -stomach upset or vomiting -tooth discoloration -vaginal irritation This list may not describe all possible side effects. Call your doctor for medical advice about side effects. You may report side effects to FDA at 1-800-FDA-1088. Where should I keep my medicine? Keep out of the reach of children. Store at room temperature between 15 and 30 degrees C (59 and 86 degrees F). Throw away any unused medicine after the expiration date. NOTE: This sheet is a summary. It may not cover all possible information. If you have questions about this medicine, talk to your doctor, pharmacist, or health care provider.  2014, [employer]. (2013-03-14 15:42:07)

## 2014-03-08 NOTE — Progress Notes (Signed)
Subjective:    Patient ID: Jonathan Wall, male    DOB: [birth year], 73 y.o.   MRN: [id number]  HPI  Jonathan Wall is here today with his wife for a surgical clearance. He is scheduled for a right total knee replacement on June 30th. He continues to have chest congestion and cough and does not feel that he has improved any even though he is completing his course of Omnicef tonight.  He would like to get another nebulizer treatment.     Review of Systems  Constitutional: Negative for fever.  HENT: Positive for congestion.   Respiratory: Positive for cough.   Cardiovascular: Negative for chest pain, palpitations and leg swelling.  Psychiatric/Behavioral: The patient is not nervous/anxious.   All other systems reviewed and are negative.    Past Medical History  Diagnosis Date  . CAD (coronary artery disease)   . Prostate cancer   . Hypertension   . Hyperlipidemia   . Osteoarthritis   . ED (erectile dysfunction)   . Hiatal hernia   . Colon polyps   . Barrett's esophagus     Clotest was negative, Jonathan Wall  . Esophagitis   . GERD (gastroesophageal reflux disease)      Past Surgical History  Procedure Laterality Date  . Coronary artery bypass graft  1997  . Angioplasty  08/21/2001    Stent- Mid RCA Jonathan Wall  . Prostate surgery      Radiation 2013 Jonathan Wall  . Replacement total knee Left      History   Social History Narrative   Marital Status: Married Jonathan Wall)   Children:  Son Jonathan Wall)   Pets:  None   Living Situation: Lives with his wife.   Occupation: Retired Orthoptist - Chartered certified accountant)    Education: Programmer, systems   Tobacco Use/Exposure:  He smoked 1 ppd for 20 years.     Alcohol Use: None   Drug Use:  None   Diet:  Regular   Exercise: Minimal 1-2 x per month   Hobbies: Internet, Writing            Family History  Problem Relation Age of Onset  . Alzheimer's disease Mother   . Alzheimer's disease Father   . Heart disease Brother   . Alcohol  abuse Brother   . Heart disease Brother      Current Outpatient Prescriptions on File Prior to Visit  Medication Sig Dispense Refill  . albuterol (PROAIR HFA) 108 (90 BASE) MCG/ACT inhaler Inhale 2 puffs into the lungs every 6 (six) hours as needed for wheezing or shortness of breath.  1 Inhaler  11  . amLODipine (NORVASC) 5 MG tablet Take 1 tablet (5 mg total) by mouth daily.  90 tablet  1  . atorvastatin (LIPITOR) 40 MG tablet Take 1 tablet (40 mg total) by mouth daily.  90 tablet  1  . benazepril (LOTENSIN) 20 MG tablet Take 1 tablet (20 mg total) by mouth daily.  90 tablet  1  . benzonatate (TESSALON) 200 MG capsule Take 1 capsule (200 mg total) by mouth 3 (three) times daily as needed for cough.  30 capsule  1  . cefdinir (OMNICEF) 300 MG capsule Take 1 capsule (300 mg total) by mouth 2 (two) times daily. Take for 10 days  20 capsule  0  . clopidogrel (PLAVIX) 75 MG tablet Take 1 tablet (75 mg total) by mouth daily.  90 tablet  1  . diclofenac (VOLTAREN) 75 MG EC tablet Take  1 tablet (75 mg total) by mouth 2 (two) times daily.  180 tablet  1  . HYDROcodone-homatropine (HYCODAN) 5-1.5 MG/5ML syrup Take 5 mLs by mouth every 8 (eight) hours as needed for cough.  120 mL  0  . metoprolol tartrate (LOPRESSOR) 25 MG tablet Take 1 tablet (25 mg total) by mouth 2 (two) times daily.  180 tablet  1  . niacin (NIASPAN) 1000 MG CR tablet Take 1 tablet (1,000 mg total) by mouth at bedtime. Take 2 tabs po QHS  90 tablet  1  . pantoprazole (PROTONIX) 40 MG tablet Take 1 tablet (40 mg total) by mouth daily.  90 tablet  3   No current facility-administered medications on file prior to visit.     No Known Allergies   Immunization History  Administered Date(s) Administered  . Influenza,inj,Quad PF,36+ Mos 06/14/2013  . Pneumococcal-Unspecified 03/06/2008  . Td 09/29/2006  . Tdap 08/02/2012  . Zoster 02/13/2008       Objective:   Physical Exam  Nursing note and vitals  reviewed. Constitutional: He appears well-nourished. No distress.  HENT:  Mouth/Throat: No oropharyngeal exudate.  Eyes: Conjunctivae are normal.  Neck: Neck supple.  Cardiovascular: Normal rate, regular rhythm and normal heart sounds.   Pulmonary/Chest: Effort normal. No respiratory distress. He has wheezes. He has no rales.  Lymphadenopathy:    He has no cervical adenopathy.      Assessment & Plan:    Jonathan Wall was seen today for head/chest congestion.  Diagnoses and associated orders for this visit:  Acute bronchitis, unspecified organism Comments: Since he has not improved with Omnicef, he may have some atypical bacteria causing his symptoms so he was given a prescription for Zithromax.    - azithromycin (ZITHROMAX) 500 MG tablet; Take 1 tablet (500 mg total) by mouth daily. Take 1 tablet daily for 3 days.  Wheezing Comments: He received a nebulizer treatment with (Performomist).    Preoperative clearance Comments: We'll put off his surgical clearance until he is feeling better.  We checked an EKG which was normal.   - EKG 12-Lead   TIME SPENT "FACE TO FACE" WITH PATIENT -  20 MINS

## 2014-03-21 DIAGNOSIS — J329 Chronic sinusitis, unspecified: Secondary | ICD-10-CM | POA: Insufficient documentation

## 2014-03-21 DIAGNOSIS — R059 Cough, unspecified: Secondary | ICD-10-CM | POA: Insufficient documentation

## 2014-03-21 DIAGNOSIS — R0989 Other specified symptoms and signs involving the circulatory and respiratory systems: Secondary | ICD-10-CM | POA: Insufficient documentation

## 2014-03-21 DIAGNOSIS — R05 Cough: Secondary | ICD-10-CM | POA: Insufficient documentation

## 2014-03-22 ENCOUNTER — Encounter: Payer: Self-pay | Admitting: Family Medicine

## 2014-03-22 ENCOUNTER — Ambulatory Visit (INDEPENDENT_AMBULATORY_CARE_PROVIDER_SITE_OTHER): Payer: Medicare Other | Admitting: Family Medicine

## 2014-03-22 VITALS — BP 110/71 | HR 72 | Resp 16 | Ht 67.5 in | Wt 196.0 lb

## 2014-03-22 DIAGNOSIS — J209 Acute bronchitis, unspecified: Secondary | ICD-10-CM | POA: Insufficient documentation

## 2014-03-22 DIAGNOSIS — Z01818 Encounter for other preprocedural examination: Secondary | ICD-10-CM

## 2014-03-22 DIAGNOSIS — R062 Wheezing: Secondary | ICD-10-CM | POA: Insufficient documentation

## 2014-03-22 NOTE — Progress Notes (Signed)
Subjective:    Patient ID: Jonathan Wall, male    DOB: 05/30/1941, 73 y.o.   MRN: [id number]  HPI  Jonathan Wall is here today for surgical clearance for his upcoming right knee replacement.  He was cleared by his cardiologist 3 days ago.  His EKG was unremarkable. He is almost completely recovered from the URI he had several weeks ago. His surgery is scheduled for 04/02/14.     Review of Systems  Constitutional: Negative for activity change, appetite change and fatigue.  Cardiovascular: Negative for chest pain, palpitations and leg swelling.  Psychiatric/Behavioral: Negative for behavioral problems and sleep disturbance. The patient is not nervous/anxious.   All other systems reviewed and are negative.    Past Medical History  Diagnosis Date  . CAD (coronary artery disease)   . Prostate cancer   . Hypertension   . Hyperlipidemia   . Osteoarthritis   . ED (erectile dysfunction)   . Hiatal hernia   . Colon polyps   . Barrett's esophagus     Clotest was negative, Dr. Bryn Wall  . Esophagitis   . GERD (gastroesophageal reflux disease)      Past Surgical History  Procedure Laterality Date  . Coronary artery bypass graft  1997  . Angioplasty  08/21/2001    Stent- Mid RCA Dr. Atilano Wall  . Prostate surgery      Radiation 2013 Dr. Thomasene Wall  . Replacement total knee Left      History   Social History Narrative   Marital Status: Married Jonathan Wall)   Children:  Son Jonathan Wall)   Pets:  None   Living Situation: Lives with his wife.   Occupation: Retired Orthoptist - Chartered certified accountant)    Education: Programmer, systems   Tobacco Use/Exposure:  He smoked 1 ppd for 20 years.     Alcohol Use: None   Drug Use:  None   Diet:  Regular   Exercise: Minimal 1-2 x per month   Hobbies: Internet, Writing            Family History  Problem Relation Age of Onset  . Alzheimer's disease Mother   . Alzheimer's disease Father   . Heart disease Brother   . Alcohol abuse Brother   . Heart  disease Brother      Current Outpatient Prescriptions on File Prior to Visit  Medication Sig Dispense Refill  . albuterol (PROAIR HFA) 108 (90 BASE) MCG/ACT inhaler Inhale 2 puffs into the lungs every 6 (six) hours as needed for wheezing or shortness of breath.  1 Inhaler  11  . amLODipine (NORVASC) 5 MG tablet Take 1 tablet (5 mg total) by mouth daily.  90 tablet  1  . atorvastatin (LIPITOR) 40 MG tablet Take 1 tablet (40 mg total) by mouth daily.  90 tablet  1  . benazepril (LOTENSIN) 20 MG tablet Take 1 tablet (20 mg total) by mouth daily.  90 tablet  1  . clopidogrel (PLAVIX) 75 MG tablet Take 1 tablet (75 mg total) by mouth daily.  90 tablet  1  . diclofenac (VOLTAREN) 75 MG EC tablet Take 1 tablet (75 mg total) by mouth 2 (two) times daily.  180 tablet  1  . metoprolol tartrate (LOPRESSOR) 25 MG tablet Take 1 tablet (25 mg total) by mouth 2 (two) times daily.  180 tablet  1  . niacin (NIASPAN) 1000 MG CR tablet Take 1 tablet (1,000 mg total) by mouth at bedtime. Take 2 tabs po QHS  90 tablet  1  . pantoprazole (PROTONIX) 40 MG tablet Take 1 tablet (40 mg total) by mouth daily.  90 tablet  3   No current facility-administered medications on file prior to visit.     No Known Allergies   Immunization History  Administered Date(s) Administered  . Influenza,inj,Quad PF,36+ Mos 06/14/2013  . Pneumococcal-Unspecified 03/06/2008  . Td 09/29/2006  . Tdap 08/02/2012  . Zoster 02/13/2008       Objective:   Physical Exam  Nursing note and vitals reviewed. Constitutional: He appears well-nourished. No distress.  HENT:  Mouth/Throat: No oropharyngeal exudate.  Eyes: Conjunctivae are normal.  Neck: Neck supple.  Cardiovascular: Normal rate, regular rhythm and normal heart sounds.   Pulmonary/Chest: Effort normal. No respiratory distress. He has no rales.  Lymphadenopathy:    He has no cervical adenopathy.       Assessment & Plan:   [name] was seen today for medical  clearance.  Diagnoses and associated orders for this visit:  Preoperative clearance Comments: He is cleared for surgery.

## 2015-07-05 DEATH — deceased
